# Patient Record
Sex: Male | Born: 1955 | Race: Black or African American | Hispanic: No | Marital: Single | Smoking: Former smoker
Health system: Southern US, Community
[De-identification: ages and names within clinical notes are randomized; demographics above are authoritative.]

## PROBLEM LIST (undated history)

## (undated) DIAGNOSIS — I10 Essential (primary) hypertension: Secondary | ICD-10-CM

## (undated) DIAGNOSIS — Z21 Asymptomatic human immunodeficiency virus [HIV] infection status: Secondary | ICD-10-CM

## (undated) DIAGNOSIS — C801 Malignant (primary) neoplasm, unspecified: Secondary | ICD-10-CM

## (undated) DIAGNOSIS — F4310 Post-traumatic stress disorder, unspecified: Secondary | ICD-10-CM

## (undated) HISTORY — PX: FEMUR FRACTURE SURGERY: SHX633

## (undated) HISTORY — PX: HERNIA REPAIR: SHX51

## (undated) HISTORY — PX: WRIST SURGERY: SHX841

## (undated) HISTORY — PX: HIP SURGERY: SHX245

## (undated) HISTORY — DX: Post-traumatic stress disorder, unspecified: F43.10

## (undated) HISTORY — PX: SPLENECTOMY: SUR1306

---

## 1997-06-17 ENCOUNTER — Encounter: Admission: RE | Admit: 1997-06-17 | Discharge: 1997-09-15 | Payer: Self-pay | Admitting: Orthopedic Surgery

## 1997-07-16 ENCOUNTER — Encounter: Admission: RE | Admit: 1997-07-16 | Discharge: 1997-10-14 | Payer: Self-pay | Admitting: Sports Medicine

## 1999-07-22 ENCOUNTER — Emergency Department (HOSPITAL_COMMUNITY): Admission: EM | Admit: 1999-07-22 | Discharge: 1999-07-22 | Payer: Self-pay | Admitting: Emergency Medicine

## 2000-04-28 ENCOUNTER — Encounter: Payer: Self-pay | Admitting: Emergency Medicine

## 2000-04-28 ENCOUNTER — Emergency Department (HOSPITAL_COMMUNITY): Admission: EM | Admit: 2000-04-28 | Discharge: 2000-04-28 | Payer: Self-pay | Admitting: Emergency Medicine

## 2000-05-12 ENCOUNTER — Encounter: Payer: Self-pay | Admitting: Emergency Medicine

## 2000-05-12 ENCOUNTER — Emergency Department (HOSPITAL_COMMUNITY): Admission: EM | Admit: 2000-05-12 | Discharge: 2000-05-12 | Payer: Self-pay | Admitting: Emergency Medicine

## 2001-09-17 ENCOUNTER — Emergency Department (HOSPITAL_COMMUNITY): Admission: EM | Admit: 2001-09-17 | Discharge: 2001-09-17 | Payer: Self-pay | Admitting: *Deleted

## 2001-09-24 ENCOUNTER — Emergency Department (HOSPITAL_COMMUNITY): Admission: EM | Admit: 2001-09-24 | Discharge: 2001-09-24 | Payer: Self-pay | Admitting: Emergency Medicine

## 2001-09-24 ENCOUNTER — Encounter: Payer: Self-pay | Admitting: Emergency Medicine

## 2002-11-28 ENCOUNTER — Inpatient Hospital Stay (HOSPITAL_COMMUNITY): Admission: EM | Admit: 2002-11-28 | Discharge: 2002-12-08 | Payer: Self-pay | Admitting: Emergency Medicine

## 2002-11-28 ENCOUNTER — Encounter: Payer: Self-pay | Admitting: Emergency Medicine

## 2002-11-30 ENCOUNTER — Encounter: Payer: Self-pay | Admitting: Internal Medicine

## 2002-12-01 ENCOUNTER — Encounter: Payer: Self-pay | Admitting: Internal Medicine

## 2002-12-03 ENCOUNTER — Encounter: Payer: Self-pay | Admitting: Infectious Diseases

## 2002-12-04 ENCOUNTER — Encounter: Payer: Self-pay | Admitting: Infectious Diseases

## 2002-12-05 ENCOUNTER — Encounter: Payer: Self-pay | Admitting: Infectious Diseases

## 2002-12-07 ENCOUNTER — Encounter: Payer: Self-pay | Admitting: Infectious Diseases

## 2002-12-08 ENCOUNTER — Encounter: Payer: Self-pay | Admitting: Internal Medicine

## 2002-12-21 ENCOUNTER — Encounter: Admission: RE | Admit: 2002-12-21 | Discharge: 2002-12-21 | Payer: Self-pay | Admitting: Infectious Diseases

## 2002-12-30 ENCOUNTER — Ambulatory Visit (HOSPITAL_COMMUNITY): Admission: RE | Admit: 2002-12-30 | Discharge: 2002-12-30 | Payer: Self-pay | Admitting: Internal Medicine

## 2002-12-30 ENCOUNTER — Encounter: Payer: Self-pay | Admitting: Internal Medicine

## 2002-12-30 ENCOUNTER — Encounter: Admission: RE | Admit: 2002-12-30 | Discharge: 2002-12-30 | Payer: Self-pay | Admitting: Internal Medicine

## 2002-12-30 ENCOUNTER — Inpatient Hospital Stay (HOSPITAL_COMMUNITY): Admission: AD | Admit: 2002-12-30 | Discharge: 2003-01-10 | Payer: Self-pay | Admitting: Infectious Diseases

## 2002-12-31 ENCOUNTER — Encounter: Payer: Self-pay | Admitting: Infectious Diseases

## 2003-01-01 ENCOUNTER — Encounter: Payer: Self-pay | Admitting: Infectious Diseases

## 2003-01-07 ENCOUNTER — Encounter (INDEPENDENT_AMBULATORY_CARE_PROVIDER_SITE_OTHER): Payer: Self-pay | Admitting: Specialist

## 2003-01-11 ENCOUNTER — Emergency Department (HOSPITAL_COMMUNITY): Admission: EM | Admit: 2003-01-11 | Discharge: 2003-01-11 | Payer: Self-pay | Admitting: Emergency Medicine

## 2003-01-12 ENCOUNTER — Inpatient Hospital Stay (HOSPITAL_COMMUNITY): Admission: EM | Admit: 2003-01-12 | Discharge: 2003-01-21 | Payer: Self-pay | Admitting: Emergency Medicine

## 2003-02-03 ENCOUNTER — Encounter: Admission: RE | Admit: 2003-02-03 | Discharge: 2003-02-03 | Payer: Self-pay | Admitting: General Surgery

## 2003-04-01 ENCOUNTER — Encounter: Admission: RE | Admit: 2003-04-01 | Discharge: 2003-04-01 | Payer: Self-pay | Admitting: Internal Medicine

## 2003-04-01 ENCOUNTER — Ambulatory Visit (HOSPITAL_COMMUNITY): Admission: RE | Admit: 2003-04-01 | Discharge: 2003-04-01 | Payer: Self-pay | Admitting: Internal Medicine

## 2003-04-15 ENCOUNTER — Encounter: Admission: RE | Admit: 2003-04-15 | Discharge: 2003-04-15 | Payer: Self-pay | Admitting: Internal Medicine

## 2003-05-12 ENCOUNTER — Encounter: Admission: RE | Admit: 2003-05-12 | Discharge: 2003-05-12 | Payer: Self-pay | Admitting: Infectious Diseases

## 2004-03-05 ENCOUNTER — Emergency Department (HOSPITAL_COMMUNITY): Admission: EM | Admit: 2004-03-05 | Discharge: 2004-03-05 | Payer: Self-pay | Admitting: Family Medicine

## 2004-03-17 ENCOUNTER — Ambulatory Visit (HOSPITAL_COMMUNITY): Admission: RE | Admit: 2004-03-17 | Discharge: 2004-03-17 | Payer: Self-pay | Admitting: Sports Medicine

## 2004-03-27 ENCOUNTER — Ambulatory Visit (HOSPITAL_COMMUNITY): Admission: RE | Admit: 2004-03-27 | Discharge: 2004-03-27 | Payer: Self-pay | Admitting: Sports Medicine

## 2004-05-08 IMAGING — CT CT PELVIS W/ CM
1 series · 16 of 32 positions shown, 20 images · IV contrast (GASTRO. & OMNIPAQUE [ID])
Comparison: none

CLINICAL DATA: Pelvic pain postoperatively.  Question abscess.  CON-NONE.
 CT OF THE ABDOMEN WITH CONTRAST
 Multidetector helical study performed during IV contrast enhancement with 100 cc of Omnipaque 300 injected at 3 cc per second. Comparison is made with 12/31/02 study.  Again seen is a small pericardial effusion.  The liver, spleen, and pancreas have normal appearance.  Several small left renal cysts noted - stable.  No evidence for an intra-abdominal abscess.  There is  a lack of intra-abdominal fat.  There is retained stool seen within the colon.
 IMPRESSION
 Small pericardial effusion again noted.  No small left renal cyst.  No evidence for an intra-abdominal abscess.
 CT OF THE PELVIS WITH IV CONTRAST
 There is no evidence for a pelvic abscess or perirectal abscess.  There is retained stool seen throughout the colon.  There is a lack of intra-abdominal fat.  There is deformity of the right iliac bone laterally - unchanged.  
 No acute process and no evidence for a pelvic or perirectal abscess.

[Series 2: routine abdomen · axial · 0.68mm/px · z∈[-370,-10]mm · 16 of 116 slices shown, 20 images]
[im 12/116  soft-tissue]
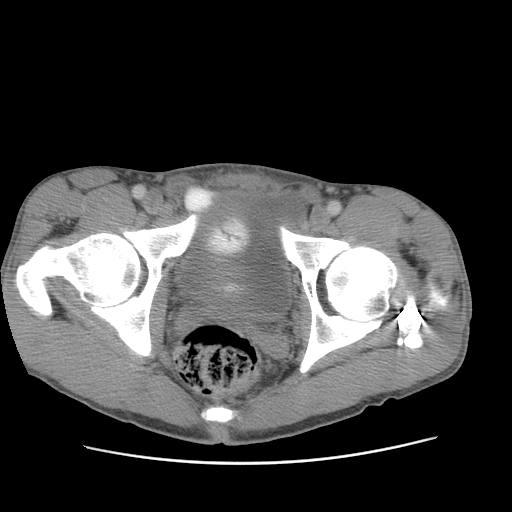
[im 12/116  bone]
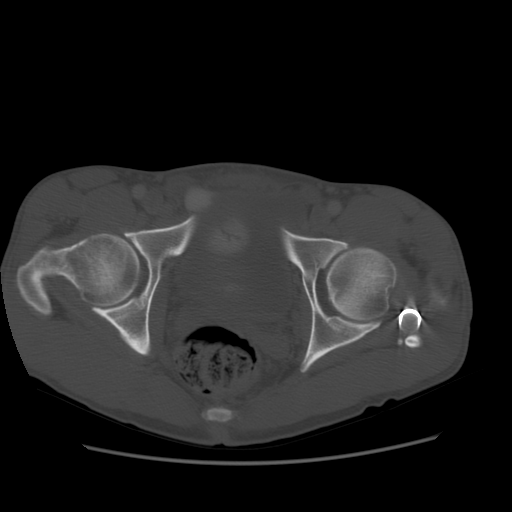
[im 19/116  soft-tissue]
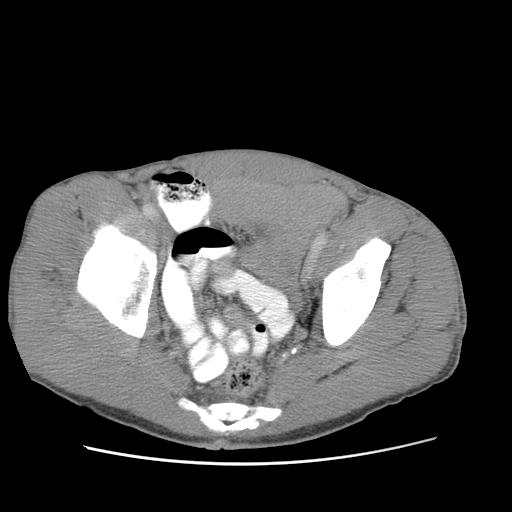
[im 26/116  soft-tissue]
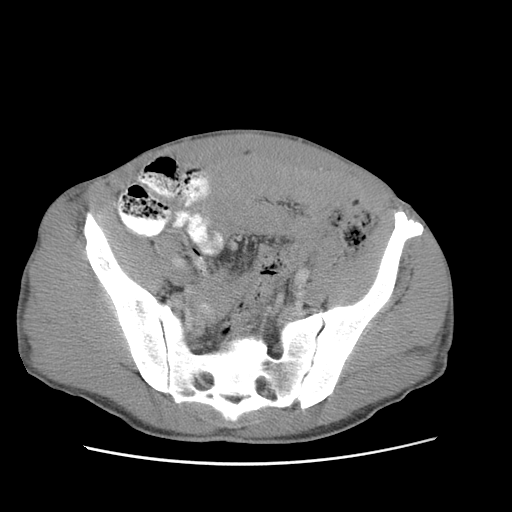
[im 34/116  soft-tissue]
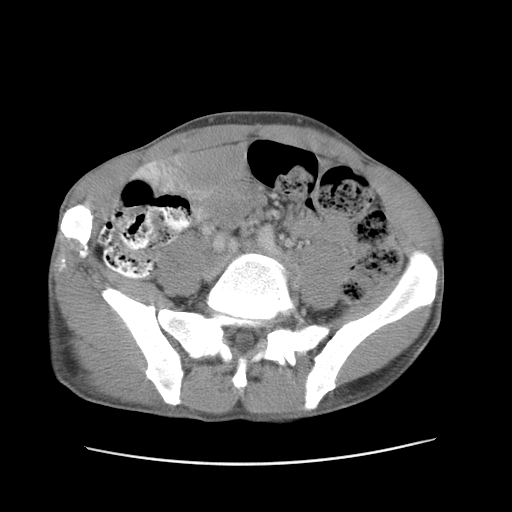
[im 41/116  soft-tissue]
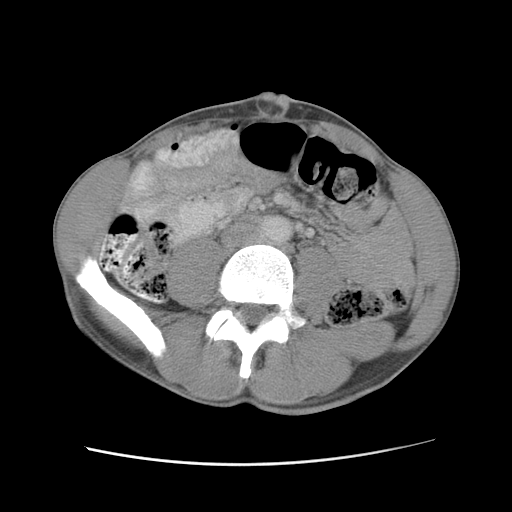
[im 49/116  soft-tissue]
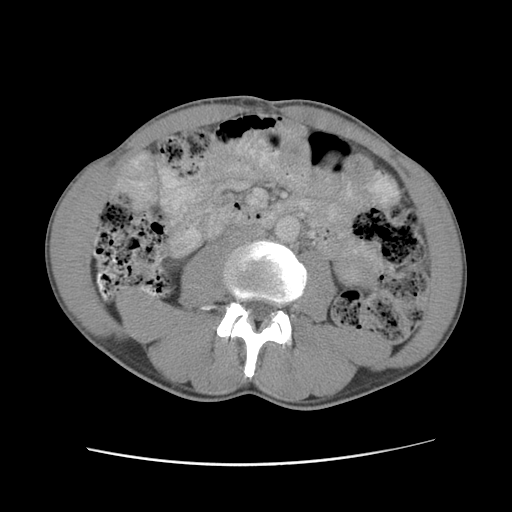
[im 56/116  soft-tissue]
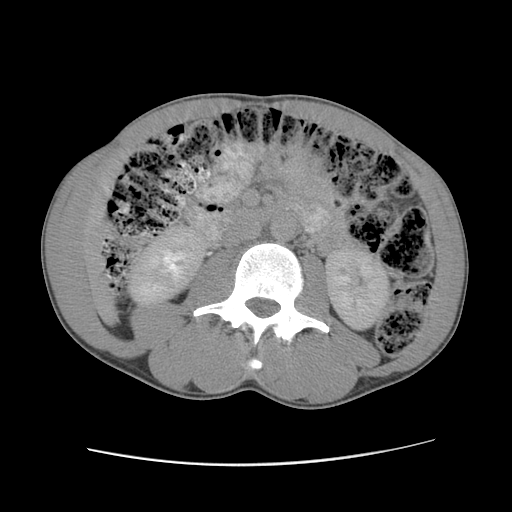
[im 64/116  soft-tissue]
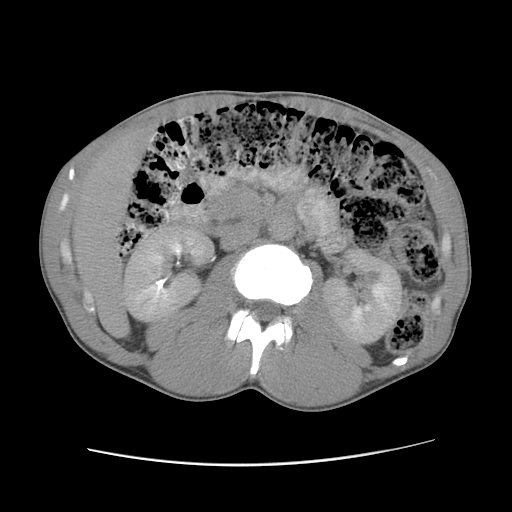
[im 71/116  soft-tissue]
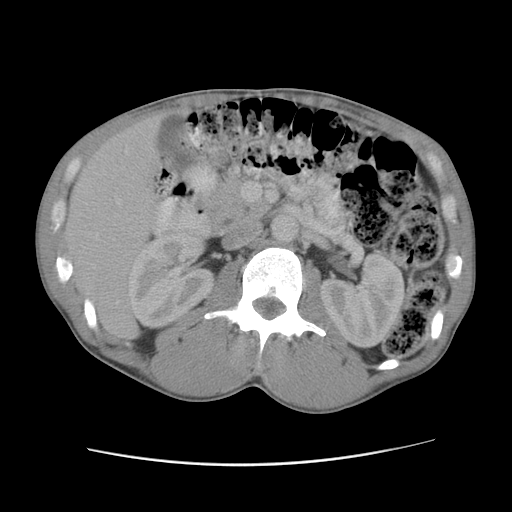
[im 71/116  bone]
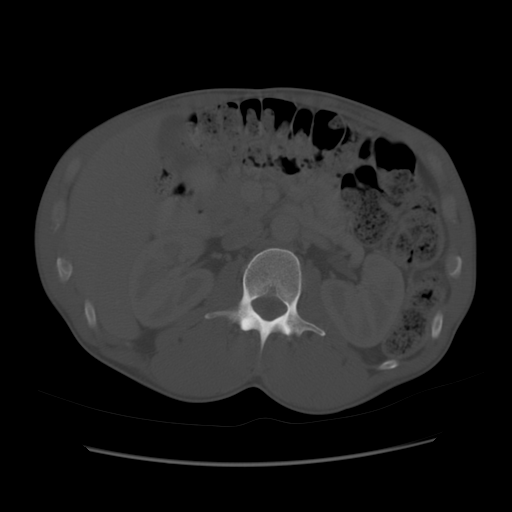
[im 78/116  soft-tissue]
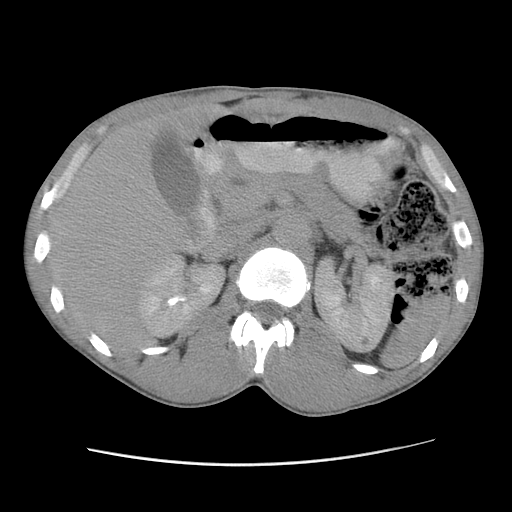
[im 86/116  soft-tissue]
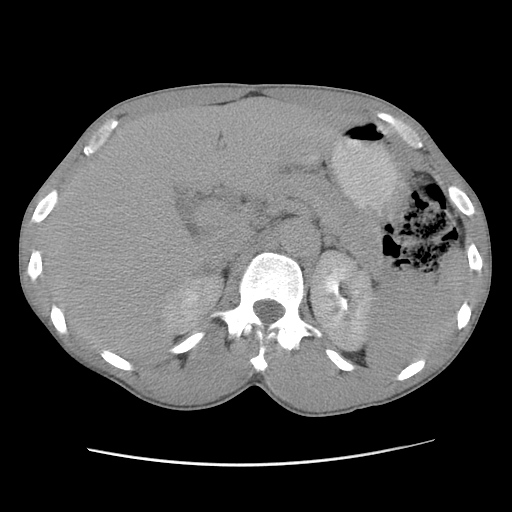
[im 93/116  soft-tissue]
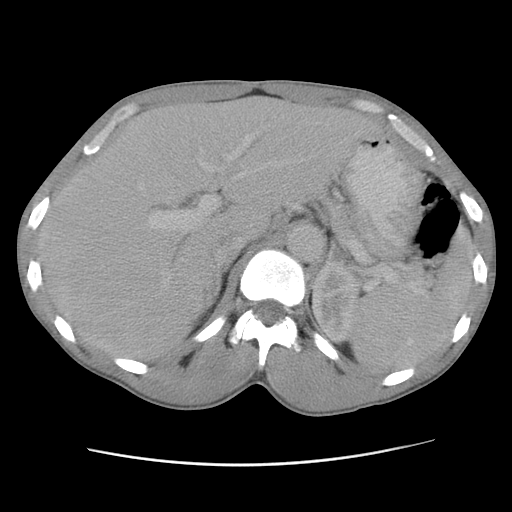
[im 101/116  soft-tissue]
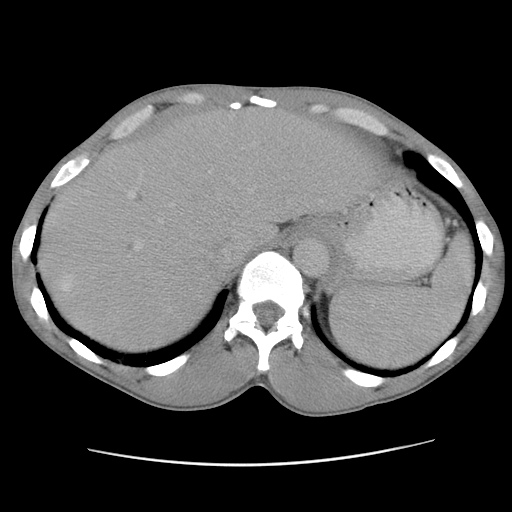
[im 101/116  lung]
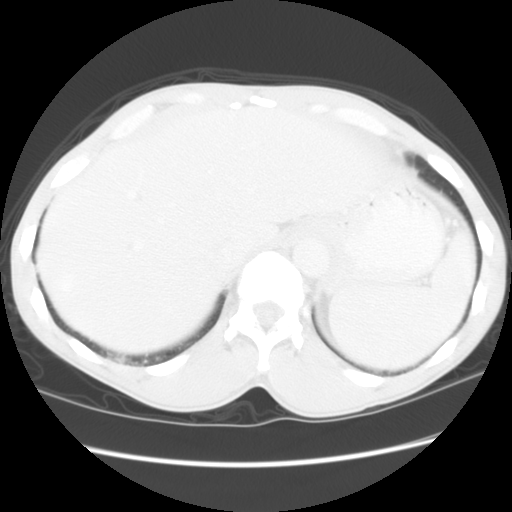
[im 104/116  lung]
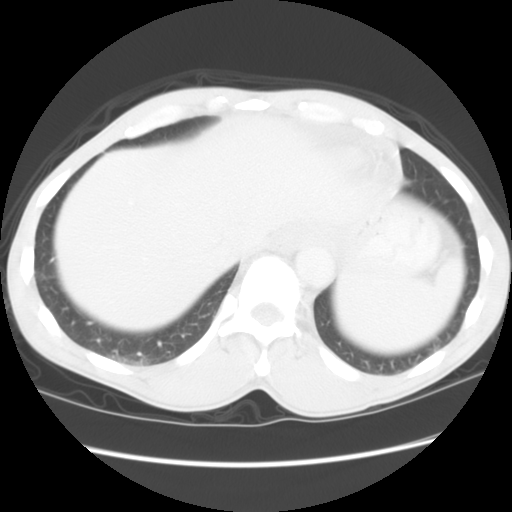
[im 108/116  soft-tissue]
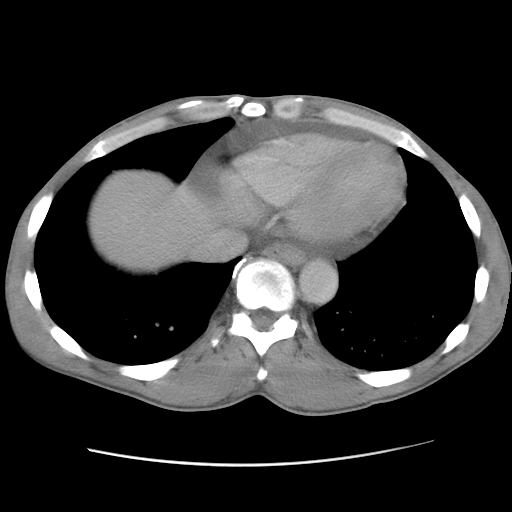
[im 108/116  lung]
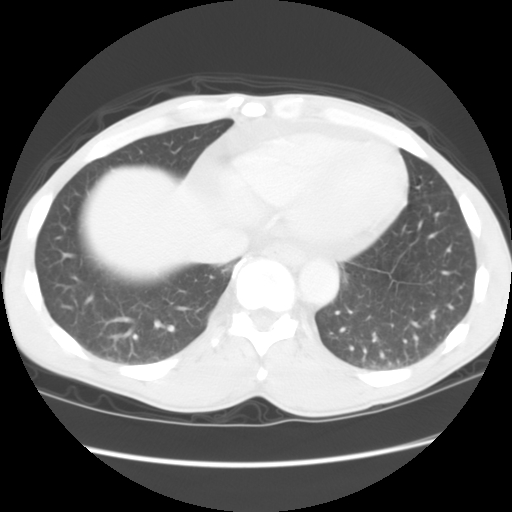
[im 112/116  lung]
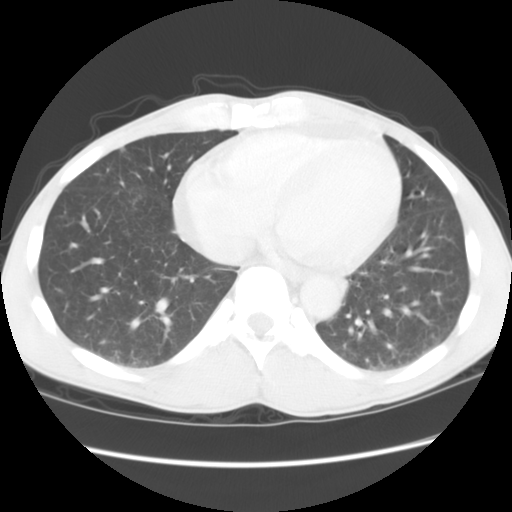

[16 of 32 positions shown; findings below may reference images not displayed]

## 2005-05-14 ENCOUNTER — Emergency Department (HOSPITAL_COMMUNITY): Admission: EM | Admit: 2005-05-14 | Discharge: 2005-05-14 | Payer: Self-pay | Admitting: Family Medicine

## 2005-07-02 ENCOUNTER — Emergency Department (HOSPITAL_COMMUNITY): Admission: EM | Admit: 2005-07-02 | Discharge: 2005-07-02 | Payer: Self-pay | Admitting: Emergency Medicine

## 2006-07-30 ENCOUNTER — Emergency Department (HOSPITAL_COMMUNITY): Admission: EM | Admit: 2006-07-30 | Discharge: 2006-07-30 | Payer: Self-pay | Admitting: Family Medicine

## 2006-10-08 ENCOUNTER — Emergency Department (HOSPITAL_COMMUNITY): Admission: EM | Admit: 2006-10-08 | Discharge: 2006-10-08 | Payer: Self-pay | Admitting: Emergency Medicine

## 2009-05-25 ENCOUNTER — Emergency Department (HOSPITAL_COMMUNITY): Admission: EM | Admit: 2009-05-25 | Discharge: 2009-05-25 | Payer: Self-pay | Admitting: Emergency Medicine

## 2010-04-08 ENCOUNTER — Encounter: Payer: Self-pay | Admitting: Sports Medicine

## 2010-04-08 ENCOUNTER — Encounter: Payer: Self-pay | Admitting: Internal Medicine

## 2010-04-10 ENCOUNTER — Emergency Department (HOSPITAL_COMMUNITY)
Admission: EM | Admit: 2010-04-10 | Discharge: 2010-04-10 | Payer: Self-pay | Source: Home / Self Care | Admitting: Emergency Medicine

## 2010-04-11 LAB — COMPREHENSIVE METABOLIC PANEL
ALT: 27 U/L (ref 0–53)
AST: 24 U/L (ref 0–37)
Albumin: 3.9 g/dL (ref 3.5–5.2)
Alkaline Phosphatase: 53 U/L (ref 39–117)
BUN: 8 mg/dL (ref 6–23)
CO2: 30 mEq/L (ref 19–32)
Calcium: 9.7 mg/dL (ref 8.4–10.5)
Chloride: 108 mEq/L (ref 96–112)
Creatinine, Ser: 1.15 mg/dL (ref 0.4–1.5)
GFR calc Af Amer: >60 mL/min (ref >60)
GFR calc non Af Amer: >60 mL/min (ref >60)
Glucose, Bld: 98 mg/dL (ref 70–99)
Potassium: 4.4 mEq/L (ref 3.5–5.1)
Sodium: 143 mEq/L (ref 135–145)
Total Bilirubin: 0.8 mg/dL (ref 0.3–1.2)
Total Protein: 7.2 g/dL (ref 6.0–8.3)

## 2010-04-11 LAB — LIPASE, BLOOD: Lipase: 18 U/L (ref 11–59)

## 2010-04-11 LAB — CBC
HCT: 44 % (ref 39.0–52.0)
Hemoglobin: 14.9 g/dL (ref 13.0–17.0)
MCH: 29.9 pg (ref 26.0–34.0)
MCHC: 33.9 g/dL (ref 30.0–36.0)
MCV: 88.2 fL (ref 78.0–100.0)
Platelets: 200 10*3/uL (ref 150–400)
RBC: 4.99 MIL/uL (ref 4.22–5.81)
RDW: 14.8 % (ref 11.5–15.5)
WBC: 3.7 10*3/uL — ABNORMAL LOW (ref 4.0–10.5)

## 2010-04-11 LAB — DIFFERENTIAL
Basophils Absolute: 0 10*3/uL (ref 0.0–0.1)
Basophils Relative: 1 % (ref 0–1)
Eosinophils Absolute: 0 10*3/uL (ref 0.0–0.7)
Eosinophils Relative: 1 % (ref 0–5)
Lymphocytes Relative: 36 % (ref 12–46)
Lymphs Abs: 1.3 10*3/uL (ref 0.7–4.0)
Monocytes Absolute: 0.3 10*3/uL (ref 0.1–1.0)
Monocytes Relative: 9 % (ref 3–12)
Neutro Abs: 2 10*3/uL (ref 1.7–7.7)
Neutrophils Relative %: 54 % (ref 43–77)

## 2010-04-11 LAB — URINALYSIS, ROUTINE W REFLEX MICROSCOPIC
Bilirubin Urine: NEGATIVE
Hgb urine dipstick: NEGATIVE
Ketones, ur: NEGATIVE mg/dL
Nitrite: NEGATIVE
Protein, ur: NEGATIVE mg/dL
Specific Gravity, Urine: 1.017 (ref 1.005–1.030)
Urine Glucose, Fasting: NEGATIVE mg/dL
Urobilinogen, UA: 0.2 mg/dL (ref 0.0–1.0)
pH: 6 (ref 5.0–8.0)

## 2010-08-04 NOTE — Discharge Summary (Signed)
   NAME:  THI, SISEMORE                     ACCOUNT NO.:  [id number]   MEDICAL RECORD NO.:  [id number]                   PATIENT TYPE:  INP   LOCATION:  5736                                 FACILITY:  [facility]   PHYSICIAN:  Madaline Guthrie, M.D.                 DATE OF BIRTH:  08/20/1955   DATE OF ADMISSION:  01/19/2003  DATE OF DISCHARGE:  01/21/2003                                 DISCHARGE SUMMARY   DISCHARGE DIAGNOSES:  1. Human immunodeficiency virus-positive with less than 90 CD4 cells.  2. Status post extensive hemorrhoidectomy.  3. Fevers.  4. 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  5. Extensive human immunodeficiency virus neuropathy.   DISCHARGE MEDICATIONS:  Mr. Petrik left suddenly and against medical  advise.  The only medicine he was discharged with was a prescription for  Tylox, which Dr. Abigail Miyamoto had left on the chart for him.   FOLLOWUP:  Mr. Souder reported that he would follow up with the [facility] on Monday, January 25, 2003.   PROCEDURES:  DICTATION ENDED AT THIS POINT.      Tama Headings, MD                           Madaline Guthrie, M.D.    AS/MEDQ  D:  01/26/2003  T:  01/27/2003  Job:  [id number]
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
## 2010-08-04 NOTE — Op Note (Signed)
NAME:  ANGELUS, HOOPES                     ACCOUNT NO.:  [id number]   MEDICAL RECORD NO.:  [id number]                   PATIENT TYPE:  INP   LOCATION:  0165                                 FACILITY:  [facility]   PHYSICIAN:  Adolph Pollack, M.D.            DATE OF BIRTH:  May 14, 1955   DATE OF PROCEDURE:  01/12/2003  DATE OF DISCHARGE:                                 OPERATIVE REPORT   PREOPERATIVE DIAGNOSIS:  Post hemorrhoidectomy bleeding.   POSTOPERATIVE DIAGNOSIS:  Post hemorrhoidectomy bleeding plus lower  gastrointestinal bleeding.   OPERATION/PROCEDURE:  1. Examination under anesthesia.  2. Anoscopy.  3. Control of rectal wall bleeding.  4. Rigid proctosigmoidoscopy.   SURGEON:  Adolph Pollack, M.D.   ANESTHESIA:  General.   INDICATIONS:  This 55 year old male is postoperative day #5 from  hemorrhoidectomy by Dr. Lindie Spruce.  He was sent home on Sunday.  He said he was  constipated and then had a bowel movement yesterday with a little bit of  blood and then today he has been passing a lot of blood.  He presented to  the [facility] Emergency Department for evaluation.  His hemoglobin on  September 23 was 9.8.  In the emergency room, today it was 9.0.  He had some  bright red blood around the anal area.  It was difficult to examine him.  He  is now brought to the operating room.   FINDINGS:  In the left lateral rectal wall, there was a raw surface with  some bleeding.  After controlling this, I performed rigid  proctosigmoidoscopy and I got at the highest 23 cm and still had old blood  clots and I could not get above these.   DESCRIPTION OF PROCEDURE:  He is brought to the operating room, placed  supine on the operating table and general anesthesia was administered.  He  was then placed in the lithotomy position.  The perianal area was sterilely  prepped and draped.  An anoscope was inserted into the anus and I examined  the area.  On the left there was some  bright red blood and multiple clots  noted.  I evacuated these clots, some of which were bright red, most of  which were dark.  I noted in the left rectal wall there was some oozing and  I put the cautery to this area with fairly good control.  I inspected all  the other areas.  There still did continue to be some oozing from the left  rectal wall and I continued to apply cautery and direct pressure until I  could get this under control.  Once I had this controlled, I then took a  rigid proctosigmoidoscope and advanced it up into the rectum and into the  sigmoid colon.  I continued to encounter multiple blood clots even 23 cm  from the anal verge.  I could not get above the blood clots.  I was  wondering if he did not have a GI source or if this was a retrograde  movement of the bleeding from the rectal area.   I went down and again inspected to raw surface of the rectal area and then  used a single 2-0 suture to approximate some of the rectum in that region.  I placed one small piece of Gelfoam directly on the raw surface where it had  been cauterized in the left rectal wall, then a larger piece was placed into  the anus.  A dry dressing was applied.   He tolerated the procedure well.  There were no apparent complications.  He  was not hypotensive through the procedure.   The plan will be to take him to the recovery room and then to the ICU and  get serial hematocrits on him.  I will request GI consultation with the  possible need for colonoscopy to detect a lower GI source of bleeding.                                                 Adolph Pollack, M.D.    Kari Baars  D:  01/12/2003  T:  01/12/2003  Job:  [id number]

## 2010-08-04 NOTE — Discharge Summary (Signed)
NAME:  Alexander Nicholson, Alexander Nicholson                     ACCOUNT NO.:  [id number]   MEDICAL RECORD NO.:  [id number]                   PATIENT TYPE:  INP   LOCATION:  0481                                 FACILITY:  [facility]   PHYSICIAN:  Sherin Quarry, MD                   DATE OF BIRTH:  [birth year]   DATE OF ADMISSION:  11/28/2002  DATE OF DISCHARGE:                                 DISCHARGE SUMMARY   HISTORY OF PRESENT ILLNESS:  Alexander Nicholson is a 55 year old gentleman  who initially presented on September 11 with a four week history of fever,  anorexia, and stomach pain with associated dyspnea on exertion and  nonproductive cough.  He estimated that he had lost about 20 pounds in  weight.  He has a long-standing history of alcohol abuse, but indicated that  he had discontinued use of alcohol about 10 days prior to coming into the  hospital.   PHYSICAL EXAMINATION:  VITAL SIGNS:  Blood pressure 100/70, heart rate 147,  respirations 20, temperature 103.8, O2 saturation 94%.  HEENT:  Within normal limits.  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  CHEST:  Fine rales diffusely.  Breath sounds seemed to be slightly  diminished.  BACK:  No CVA or point tenderness.  CARDIOVASCULAR:  Normal S1 and S2.  There were no rubs, murmurs, or gallops.  ABDOMEN:  Benign.  Normal bowel sounds without masses, tenderness,  organomegaly.  NEUROLOGIC:  Normal.  EXTREMITIES:  Normal.   LABORATORIES:  Subsequently, the patient had serial blood cultures which  were negative.  Cryptococcal antigen was negative.  RPR was nonreactive.  Toxoplasma serology was negative.  HIV serology was positive.  This was  confirmed by Western Blot.  The serum sodium was 135, potassium 3.8,  chloride 101, bicarbonate 26, creatinine 1.3, BUN 23.  Chest x-ray showed a  diffuse infiltrative process consistent with a bilateral atypical pneumonia.   HOSPITAL COURSE:  On admission the patient was  initially placed on a regimen  of Zithromax 500 mg q.24h., Rocephin 1 g q.24h., and Septra 20 mg/kg with  trimethoprim component divided into four equal doses daily.  Subsequently,  on September 12 the patient was started on prednisone 40 mg daily.  On  September 14 the patient was noted to be more hypoxic and tachycardic with  moderate respiratory distress.  The patient was therefore transferred to the  intensive care unit for more careful monitoring.  Consultation was obtained  with Rockey Situ. Roxan Hockey, M.D. of the infectious disease service who  recommended that the patient be maintained on his current regimen.  He  recommended that bronchoscopy be carried out if the patient failed to  respond to this treatment regimen.  Subsequently, the patient was very  carefully monitored.  By September 21 the patient was afebrile.  His LDH  values were nearing normal.  His chest  x-ray was improved and was felt  reasonable to discharge the patient at that time.   DISCHARGE DIAGNOSES:  1. Human immunodeficiency virus infection with CD-4 count of 30 with T     helper percentage of 9%.  2. 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  3. Chronic alcohol abuse.  4. 50-pack-year smoking history.   DISCHARGE MEDICATIONS:  1. Bactrim DS two tablets t.i.d. for two more weeks.  2. Prednisone 40 mg daily for five days, then 20 mg daily for 11 days.  3. Pepcid 20 mg daily.  4. Truvada one tablet daily.  5. Sustiva 600 mg one tablet daily.   FOLLOWUP:  The patient will follow up with Rockey Situ. Roxan Hockey, M.D. in his  office on October 4.  This appointment has been scheduled.   CONDITION ON DISCHARGE:  Fair.                                               Sherin Quarry, MD    SY/MEDQ  D:  12/08/2002  T:  12/08/2002  Job:  [id number]
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
## 2010-08-23 ENCOUNTER — Emergency Department (HOSPITAL_COMMUNITY)
Admission: EM | Admit: 2010-08-23 | Discharge: 2010-08-23 | Disposition: A | Discharge status: Home / Self Care | Payer: Non-veteran care | Attending: Emergency Medicine | Admitting: Emergency Medicine

## 2010-08-23 ENCOUNTER — Emergency Department (HOSPITAL_COMMUNITY): Payer: Non-veteran care

## 2010-08-23 DIAGNOSIS — R197 Diarrhea, unspecified: Secondary | ICD-10-CM | POA: Insufficient documentation

## 2010-08-23 DIAGNOSIS — R109 Unspecified abdominal pain: Secondary | ICD-10-CM | POA: Insufficient documentation

## 2010-08-23 DIAGNOSIS — R112 Nausea with vomiting, unspecified: Secondary | ICD-10-CM | POA: Insufficient documentation

## 2010-08-23 DIAGNOSIS — Z21 Asymptomatic human immunodeficiency virus [HIV] infection status: Secondary | ICD-10-CM | POA: Insufficient documentation

## 2010-08-23 DIAGNOSIS — I10 Essential (primary) hypertension: Secondary | ICD-10-CM | POA: Insufficient documentation

## 2010-08-23 LAB — DIFFERENTIAL
Basophils Absolute: 0 10*3/uL (ref 0.0–0.1)
Basophils Relative: 0 % (ref 0–1)
Lymphocytes Relative: 50 % — ABNORMAL HIGH (ref 12–46)
Monocytes Absolute: 0.3 10*3/uL (ref 0.1–1.0)
Neutro Abs: 1.5 10*3/uL — ABNORMAL LOW (ref 1.7–7.7)
Neutrophils Relative %: 40 % — ABNORMAL LOW (ref 43–77)

## 2010-08-23 LAB — CBC
Hemoglobin: 14 g/dL (ref 13.0–17.0)
MCHC: 33.1 g/dL (ref 30.0–36.0)
WBC: 3.7 10*3/uL — ABNORMAL LOW (ref 4.0–10.5)

## 2010-08-23 LAB — URINALYSIS, ROUTINE W REFLEX MICROSCOPIC
Glucose, UA: NEGATIVE mg/dL
Protein, ur: NEGATIVE mg/dL
Specific Gravity, Urine: 1.024 (ref 1.005–1.030)
Urobilinogen, UA: 0.2 mg/dL (ref 0.0–1.0)

## 2010-08-23 LAB — COMPREHENSIVE METABOLIC PANEL
ALT: 36 U/L (ref 0–53)
AST: 30 U/L (ref 0–37)
Albumin: 4.1 g/dL (ref 3.5–5.2)
Calcium: 9.3 mg/dL (ref 8.4–10.5)
Chloride: 106 mEq/L (ref 96–112)
Creatinine, Ser: 1.09 mg/dL (ref 0.4–1.5)
GFR calc Af Amer: >60 mL/min (ref >60)
Sodium: 138 mEq/L (ref 135–145)

## 2010-08-23 MED ORDER — IOHEXOL 300 MG/ML  SOLN
100.0000 mL | Freq: Once | INTRAMUSCULAR | Status: AC | PRN
  Administered 2010-08-23: 100 mL via INTRAVENOUS

## 2011-01-01 LAB — URINALYSIS, ROUTINE W REFLEX MICROSCOPIC
Glucose, UA: NEGATIVE
Hgb urine dipstick: NEGATIVE
Specific Gravity, Urine: 1.021

## 2011-01-01 LAB — CBC
MCV: 95
Platelets: 213
WBC: 4.8

## 2011-01-01 LAB — DIFFERENTIAL
Basophils Absolute: 0
Eosinophils Relative: 0
Lymphocytes Relative: 36
Monocytes Absolute: 0.3

## 2011-01-01 LAB — COMPREHENSIVE METABOLIC PANEL
AST: 27
Albumin: 3.9
Chloride: 105
Creatinine, Ser: 1.35
GFR calc Af Amer: >60
Potassium: 3.7
Sodium: 138
Total Bilirubin: 1.1

## 2011-01-01 LAB — CULTURE, BLOOD (ROUTINE X 2): Culture: NO GROWTH

## 2011-02-20 ENCOUNTER — Emergency Department (HOSPITAL_COMMUNITY)
Admission: EM | Admit: 2011-02-20 | Discharge: 2011-02-20 | Disposition: A | Discharge status: Home / Self Care | Payer: Non-veteran care | Attending: Emergency Medicine | Admitting: Emergency Medicine

## 2011-02-20 ENCOUNTER — Other Ambulatory Visit: Payer: Self-pay

## 2011-02-20 ENCOUNTER — Emergency Department (HOSPITAL_COMMUNITY): Payer: Non-veteran care

## 2011-02-20 ENCOUNTER — Encounter: Payer: Self-pay | Admitting: *Deleted

## 2011-02-20 DIAGNOSIS — I10 Essential (primary) hypertension: Secondary | ICD-10-CM | POA: Insufficient documentation

## 2011-02-20 DIAGNOSIS — R05 Cough: Secondary | ICD-10-CM | POA: Insufficient documentation

## 2011-02-20 DIAGNOSIS — J40 Bronchitis, not specified as acute or chronic: Secondary | ICD-10-CM

## 2011-02-20 DIAGNOSIS — R059 Cough, unspecified: Secondary | ICD-10-CM | POA: Insufficient documentation

## 2011-02-20 DIAGNOSIS — R509 Fever, unspecified: Secondary | ICD-10-CM | POA: Insufficient documentation

## 2011-02-20 DIAGNOSIS — R079 Chest pain, unspecified: Secondary | ICD-10-CM | POA: Insufficient documentation

## 2011-02-20 DIAGNOSIS — Z21 Asymptomatic human immunodeficiency virus [HIV] infection status: Secondary | ICD-10-CM | POA: Insufficient documentation

## 2011-02-20 HISTORY — DX: Asymptomatic human immunodeficiency virus (hiv) infection status: Z21

## 2011-02-20 HISTORY — DX: Essential (primary) hypertension: I10

## 2011-02-20 MED ORDER — ALBUTEROL SULFATE HFA 108 (90 BASE) MCG/ACT IN AERS
2.0000 | INHALATION_SPRAY | Freq: Once | RESPIRATORY_TRACT | Status: AC
  Administered 2011-02-20: 2 via RESPIRATORY_TRACT
  Filled 2011-02-20: qty 6.7

## 2011-02-20 MED ORDER — OSELTAMIVIR PHOSPHATE 75 MG PO CAPS: 75.0000 mg | ORAL_CAPSULE | Freq: Two times a day (BID) | ORAL | Status: AC

## 2011-02-20 MED ORDER — DOXYCYCLINE HYCLATE 100 MG PO CAPS: 100.0000 mg | ORAL_CAPSULE | Freq: Two times a day (BID) | ORAL | Status: AC

## 2011-02-20 MED ORDER — HYDROCODONE-ACETAMINOPHEN 5-500 MG PO TABS: 1.0000 | ORAL_TABLET | Freq: Four times a day (QID) | ORAL | Status: DC | PRN

## 2011-02-20 MED ORDER — PREDNISONE 20 MG PO TABS: 20.0000 mg | ORAL_TABLET | Freq: Two times a day (BID) | ORAL | Status: AC

## 2011-02-20 MED ORDER — OXYCODONE-ACETAMINOPHEN 5-325 MG PO TABS
1.0000 | ORAL_TABLET | Freq: Once | ORAL | Status: AC
  Administered 2011-02-20: 1 via ORAL
  Filled 2011-02-20: qty 1

## 2011-02-20 MED ORDER — IPRATROPIUM BROMIDE 0.02 % IN SOLN
0.5000 mg | Freq: Once | RESPIRATORY_TRACT | Status: AC
Start: 2011-02-20 — End: 2011-02-20
  Administered 2011-02-20: 0.5 mg via RESPIRATORY_TRACT
  Filled 2011-02-20: qty 2.5

## 2011-02-20 MED ORDER — AEROCHAMBER Z-STAT PLUS/MEDIUM MISC
1.0000 | Freq: Once | Status: DC
  Filled 2011-02-20: qty 1

## 2011-02-20 MED ORDER — PREDNISONE 20 MG PO TABS
60.0000 mg | ORAL_TABLET | Freq: Once | ORAL | Status: AC
  Administered 2011-02-20: 60 mg via ORAL
  Filled 2011-02-20: qty 3

## 2011-02-20 MED ORDER — ALBUTEROL SULFATE (5 MG/ML) 0.5% IN NEBU
5.0000 mg | INHALATION_SOLUTION | Freq: Once | RESPIRATORY_TRACT | Status: AC
  Administered 2011-02-20: 5 mg via RESPIRATORY_TRACT

## 2011-02-20 MED ORDER — ALBUTEROL SULFATE (5 MG/ML) 0.5% IN NEBU
INHALATION_SOLUTION | RESPIRATORY_TRACT | Status: AC
  Administered 2011-02-20: 5 mg via RESPIRATORY_TRACT
  Filled 2011-02-20: qty 0.5

## 2011-02-20 MED ORDER — DOXYCYCLINE HYCLATE 100 MG PO TABS
100.0000 mg | ORAL_TABLET | Freq: Once | ORAL | Status: AC
  Administered 2011-02-20: 100 mg via ORAL
  Filled 2011-02-20: qty 1

## 2011-02-20 MED ORDER — HYDROCODONE-ACETAMINOPHEN 5-500 MG PO TABS: 1.0000 | ORAL_TABLET | Freq: Four times a day (QID) | ORAL | Status: AC | PRN

## 2011-02-20 NOTE — ED Notes (Signed)
Pt reports non-productive cough and cp since Thursday.  Pt also reports SOB x 2 months now.  [facility].  Pt reports SOB is worse with exertion.  No swelling of the ankles noted.  Pt placed on the heart monitor.

## 2011-02-20 NOTE — ED Provider Notes (Signed)
History     CSN: [id number] Arrival date & time: 02/20/2011 10:27 AM   First MD Initiated Contact with Patient 02/20/11 1135      Chief Complaint  Patient presents with  . Cough    productive, thick, green mucus  . Chest Pain  . Fever    (Consider location/radiation/quality/duration/timing/severity/associated sxs/prior treatment) Patient is a 55 y.o. male presenting with cough. The history is provided by the patient.  Cough The current episode started 2 days ago. The problem occurs every few minutes. The problem has been gradually worsening. The cough is productive of sputum. The maximum temperature recorded prior to his arrival was 101 to 101.9 F. Associated symptoms include chest pain (Intermittently for one week, and thenconstant for 2 days-still present.). He has tried nothing for the symptoms. He is not a smoker. His past medical history does not include bronchitis, COPD or asthma.   He has not had similar problems to this. He did not take an influenza vaccine this year. His chest pain is mild and worsens when he coughs.  Past Medical History  Diagnosis Date  . Hypertension   . HIV positive     Past Surgical History  Procedure Date  . Wrist surgery     right  . Hip surgery     left    No family history on file.  History  Substance Use Topics  . Smoking status: Current Some Day Smoker  . Smokeless tobacco: Not on file  . Alcohol Use: No      Review of Systems  Respiratory: Positive for cough.   Cardiovascular: Positive for chest pain (Intermittently for one week, and thenconstant for 2 days-still present.).  All other systems reviewed and are negative.    Allergies  Review of patient's allergies indicates no known allergies.  Home Medications   Current Outpatient Rx  Name Route Sig Dispense Refill  . DOXYCYCLINE HYCLATE 100 MG PO CAPS Oral Take 1 capsule (100 mg total) by mouth 2 (two) times daily. 20 capsule 0  . HYDROCODONE-ACETAMINOPHEN 5-500 MG PO  TABS Oral Take 1-2 tablets by mouth every 6 (six) hours as needed for pain. 15 tablet 0  . OSELTAMIVIR PHOSPHATE 75 MG PO CAPS Oral Take 1 capsule (75 mg total) by mouth every 12 (twelve) hours. 10 capsule 0  . PREDNISONE 20 MG PO TABS Oral Take 1 tablet (20 mg total) by mouth 2 (two) times daily. 10 tablet 0    BP 138/94  Pulse 77  Temp(Src) 99.8 F (37.7 C) (Oral)  Resp 18  Wt 185 lb (83.915 kg)  SpO2 100%  Physical Exam  Constitutional: He is oriented to person, place, and time. He appears well-developed and well-nourished.  HENT:  Head: Normocephalic and atraumatic.  Eyes: EOM are normal. Pupils are equal, round, and reactive to light.  Neck: Normal range of motion. Neck supple.  Cardiovascular: Normal rate and regular rhythm.   Pulmonary/Chest: He is in respiratory distress (coughing). He has no wheezes. He has no rales. He exhibits no tenderness.  Abdominal: Soft. Bowel sounds are normal.  Musculoskeletal: Normal range of motion. He exhibits no edema and no tenderness.  Neurological: He is alert and oriented to person, place, and time. No cranial nerve deficit. He exhibits normal muscle tone. Coordination normal.  Skin: Skin is warm and dry.  Psychiatric: He has a normal mood and affect. His behavior is normal. Judgment and thought content normal.    ED Course  Procedures (including critical care time)  Date: 02/20/2011  Rate: 81  Rhythm: normal sinus rhythm  QRS Axis: normal  Intervals: normal  ST/T Wave abnormalities: nonspecific ST abnormality  Conduction Disutrbances:none  Narrative Interpretation:   Old EKG Reviewed: unchanged  Labs Reviewed - No data to display Dg Chest 2 View  02/20/2011  *RADIOLOGY REPORT*  Clinical Data: Cough.  Chest congestion.  Mid chest pain.  Fever. Chills.  CHEST - 2 VIEW 02/20/2011:  Comparison: Two-view chest x-ray 10/08/2006 [facility] and two-view chest x-ray 04/01/2003 [facility].  Findings: Cardiac silhouette  mildly enlarged but stable.  Thoracic aorta tortuous, unchanged.  Hilar and mediastinal contours otherwise unremarkable.  Moderate central peribronchial thickening, more so than on the prior examinations.  No localized airspace consolidation.  No pleural effusions.  Slight thoracic scoliosis convex right.  IMPRESSION: Mild cardiomegaly without pulmonary edema.  Mild to moderate changes of bronchitis and/or asthma.  No acute cardiopulmonary disease otherwise.  Original Report Authenticated By: Arnell Sieving, M.D.     1. Bronchitis       MDM  Bronchitis, without pneumonia. Possibility of influenza, although he is 7 days into the illness. However, since he is HIV, disease. We'll cover him with Tamiflu. He is stable for discharge.        Flint Melter, MD 02/20/11 [phone]

## 2011-02-20 NOTE — ED Notes (Signed)
Pt reports breathing tx did not help, it made his feel more "tight."  Pt is also requesting something for pain.  Dr. Effie Shy notified

## 2011-02-20 NOTE — ED Notes (Signed)
Pt states "I started having cough, fever last Thursday, c/p started Friday, hurts worse with coughing"; cough produces thick green mucus

## 2011-03-20 DIAGNOSIS — C801 Malignant (primary) neoplasm, unspecified: Secondary | ICD-10-CM

## 2011-03-20 HISTORY — DX: Malignant (primary) neoplasm, unspecified: C80.1

## 2012-03-07 ENCOUNTER — Encounter (HOSPITAL_COMMUNITY): Payer: Self-pay | Admitting: Emergency Medicine

## 2012-03-07 ENCOUNTER — Emergency Department (HOSPITAL_COMMUNITY): Payer: Non-veteran care

## 2012-03-07 ENCOUNTER — Emergency Department (HOSPITAL_COMMUNITY)
Admission: EM | Admit: 2012-03-07 | Discharge: 2012-03-07 | Disposition: A | Discharge status: Home / Self Care | Payer: Non-veteran care | Attending: Emergency Medicine | Admitting: Emergency Medicine

## 2012-03-07 DIAGNOSIS — F17200 Nicotine dependence, unspecified, uncomplicated: Secondary | ICD-10-CM | POA: Insufficient documentation

## 2012-03-07 DIAGNOSIS — J069 Acute upper respiratory infection, unspecified: Secondary | ICD-10-CM | POA: Insufficient documentation

## 2012-03-07 DIAGNOSIS — Z7982 Long term (current) use of aspirin: Secondary | ICD-10-CM | POA: Insufficient documentation

## 2012-03-07 DIAGNOSIS — J40 Bronchitis, not specified as acute or chronic: Secondary | ICD-10-CM

## 2012-03-07 DIAGNOSIS — I10 Essential (primary) hypertension: Secondary | ICD-10-CM | POA: Insufficient documentation

## 2012-03-07 DIAGNOSIS — Z21 Asymptomatic human immunodeficiency virus [HIV] infection status: Secondary | ICD-10-CM | POA: Insufficient documentation

## 2012-03-07 MED ORDER — ANTIPYRINE-BENZOCAINE 5.4-1.4 % OT SOLN
3.0000 [drp] | Freq: Once | OTIC | Status: AC
  Administered 2012-03-07: 4 [drp] via OTIC
  Filled 2012-03-07: qty 10

## 2012-03-07 MED ORDER — AZITHROMYCIN 250 MG PO TABS
ORAL_TABLET | ORAL | Status: AC
  Filled 2012-03-07: qty 2

## 2012-03-07 MED ORDER — AZITHROMYCIN 250 MG PO TABS: 250.0000 mg | ORAL_TABLET | Freq: Every day | ORAL | Status: DC

## 2012-03-07 MED ORDER — HYDROCODONE-ACETAMINOPHEN 7.5-500 MG/15ML PO SOLN
10.0000 mL | Freq: Once | ORAL | Status: AC
  Administered 2012-03-07: 10 mL via ORAL
  Filled 2012-03-07: qty 15

## 2012-03-07 MED ORDER — HYDROCODONE-ACETAMINOPHEN 7.5-500 MG/15ML PO SOLN: 15.0000 mL | Freq: Four times a day (QID) | ORAL | Status: DC | PRN

## 2012-03-07 MED ORDER — AZITHROMYCIN 250 MG PO TABS
500.0000 mg | ORAL_TABLET | Freq: Once | ORAL | Status: AC
  Administered 2012-03-07: 500 mg via ORAL

## 2012-03-07 NOTE — ED Notes (Signed)
Pt alert, arrives from home, c/o cough, sob, onset was several days ago, states "i tried to get down to the [place]", moist cough noted, resp even unlabored, skin pwd

## 2012-03-07 NOTE — ED Notes (Signed)
Pt with chest pain worse with inspiration coughing up yellow sputum.  Patient started feeling bad on Monday.  Pain extends to patient's left ear and head.  Patient also has nasal congestion.  Has not slept in several nights due to cough.  Also has sore throat.  Body aches and chills. Patient denies vomiting, and diarrhea.

## 2012-03-07 NOTE — ED Provider Notes (Signed)
History     CSN: [id number]  Arrival date & time 03/07/12  2101   First MD Initiated Contact with Patient 03/07/12 2217      Chief Complaint  Patient presents with  . Cough    (Consider location/radiation/quality/duration/timing/severity/associated sxs/prior treatment) HPI  56 year old male with history of HIV presents complaining of URI sxs.  Patient reports for the past 4 days he has had gradual onset of left ear pain, sneezing, runny nose, nasal congestion, sore throat, pleuritic chest pain, sob and cough productive with yellow sputum. Onset was gradual, persistent, worsening, moderate in severity, with no relief after using over-the-counter medication. He denies fever, chills, hemoptysis, cp, nausea, vomiting diarrhea, abdominal pain, or leg swelling.  No rash.  Pt sts his HIV is under controlled with his regular meds.  Does not recall last CD4 count or viral load. No recent sick contact, weight changes, or myalgias.    Past Medical History  Diagnosis Date  . Hypertension   . HIV positive     Past Surgical History  Procedure Date  . Wrist surgery     right  . Hip surgery     left    No family history on file.  History  Substance Use Topics  . Smoking status: Current Some Day Smoker  . Smokeless tobacco: Not on file  . Alcohol Use: No      Review of Systems  All other systems reviewed and are negative.    Allergies  Review of patient's allergies indicates no known allergies.  Home Medications   Current Outpatient Rx  Name  Route  Sig  Dispense  Refill  . ASPIRIN EFFERVESCENT 325 MG PO TBEF   Oral   Take 325 mg by mouth every 6 (six) hours as needed. For cold symptoms         . IBUPROFEN 200 MG PO TABS   Oral   Take 200 mg by mouth every 6 (six) hours as needed. For pain           BP 153/89  Pulse 86  Temp 98 F (36.7 C) (Oral)  Resp 16  SpO2 97%  Physical Exam  Nursing note and vitals reviewed. Constitutional: He appears  well-developed and well-nourished. No distress.       Awake, alert, nontoxic appearance  HENT:  Head: Atraumatic.       Cerumen in ears bilaterally.  Rhinorrhea, mild post oropharyngeal erythema without evidence of deep tissue infection.  Uvula midline.   Eyes: Conjunctivae normal are normal. Right eye exhibits no discharge. Left eye exhibits no discharge.  Neck: Normal range of motion. Neck supple.  Cardiovascular: Normal rate and regular rhythm.   Pulmonary/Chest: Effort normal. No respiratory distress. He has no wheezes. He has no rales. He exhibits no tenderness.  Abdominal: Soft. There is no tenderness. There is no rebound.  Musculoskeletal: He exhibits no edema and no tenderness.       ROM appears intact, no obvious focal weakness  Neurological: He is alert.  Skin: Skin is warm and dry. No rash noted.  Psychiatric: He has a normal mood and affect.    ED Course  Procedures (including critical care time)  Labs Reviewed - No data to display No results found.   Dg Chest 2 View  03/07/2012  *RADIOLOGY REPORT*  Clinical Data: Cough, shortness of breath.  CHEST - 2 VIEW  Comparison: 02/20/2011  Findings: Mildly prominent cardiomediastinal contours with aortic tortuosity, similar to prior.  Mild central vascular congestion.  Central peribronchial thickening is similar to prior.  No confluent airspace opacity, pleural effusion, or pneumothorax.  No acute osseous finding.  IMPRESSION: Prominent cardiomediastinal contours are similar to prior.  Chronic versus acute on chronic bronchitic changes.   Original Report Authenticated By: Jearld Lesch, M.D.     1. URI 2. Bronchitis   MDM  HIV+ pt with URI sxs.  He is afebrile,  VSS.  Xray ordered.  Pt c/o L ear pain.  Unable to fully visualized TM due to cerumen impaction.  Antipyrine benzocaine ear drops ordered.  Lortab for cough.    11:29 PM cxry shows bronchitis.  VSS, afebrile.  Since pt has hx of HIV and is a smoker, will d/c with  zithromax.  Cough medication given.  F/u with PCP.  Pt voice understanding and agrees with plan.    BP 153/89  Pulse 86  Temp 98 F (36.7 C) (Oral)  Resp 16  SpO2 97%  I have reviewed nursing notes and vital signs. I personally reviewed the imaging tests through PACS system  I reviewed available ER/hospitalization records thought the EMR       Fayrene Helper, [place] 03/07/12 2330

## 2012-03-08 NOTE — ED Provider Notes (Signed)
Medical screening examination/treatment/procedure(s) were performed by non-physician practitioner and as supervising physician I was immediately available for consultation/collaboration.   Carleene Cooper III, MD 03/08/12 [phone]

## 2013-11-03 ENCOUNTER — Encounter: Payer: Self-pay | Admitting: Gastroenterology

## 2014-01-04 ENCOUNTER — Encounter (HOSPITAL_COMMUNITY): Payer: Self-pay | Admitting: Emergency Medicine

## 2014-01-04 ENCOUNTER — Emergency Department (HOSPITAL_COMMUNITY)
Admission: EM | Admit: 2014-01-04 | Discharge: 2014-01-04 | Disposition: A | Discharge status: Home / Self Care | Payer: Non-veteran care | Attending: Emergency Medicine | Admitting: Emergency Medicine

## 2014-01-04 ENCOUNTER — Emergency Department (HOSPITAL_COMMUNITY): Payer: Non-veteran care

## 2014-01-04 DIAGNOSIS — K5903 Drug induced constipation: Secondary | ICD-10-CM

## 2014-01-04 DIAGNOSIS — Z720 Tobacco use: Secondary | ICD-10-CM | POA: Diagnosis not present

## 2014-01-04 DIAGNOSIS — R109 Unspecified abdominal pain: Secondary | ICD-10-CM

## 2014-01-04 DIAGNOSIS — Z9889 Other specified postprocedural states: Secondary | ICD-10-CM | POA: Diagnosis not present

## 2014-01-04 DIAGNOSIS — R10A1 Flank pain, right side: Secondary | ICD-10-CM

## 2014-01-04 DIAGNOSIS — T3995XA Adverse effect of unspecified nonopioid analgesic, antipyretic and antirheumatic, initial encounter: Secondary | ICD-10-CM | POA: Diagnosis not present

## 2014-01-04 DIAGNOSIS — Z21 Asymptomatic human immunodeficiency virus [HIV] infection status: Secondary | ICD-10-CM | POA: Diagnosis not present

## 2014-01-04 DIAGNOSIS — I10 Essential (primary) hypertension: Secondary | ICD-10-CM | POA: Insufficient documentation

## 2014-01-04 DIAGNOSIS — K5900 Constipation, unspecified: Secondary | ICD-10-CM | POA: Diagnosis not present

## 2014-01-04 LAB — CBC WITH DIFFERENTIAL/PLATELET
Basophils Absolute: 0 10*3/uL (ref 0.0–0.1)
Basophils Relative: 0 % (ref 0–1)
EOS PCT: 1 % (ref 0–5)
Eosinophils Absolute: 0 10*3/uL (ref 0.0–0.7)
HCT: 43.1 % (ref 39.0–52.0)
HEMOGLOBIN: 14.5 g/dL (ref 13.0–17.0)
LYMPHS ABS: 1.5 10*3/uL (ref 0.7–4.0)
LYMPHS PCT: 27 % (ref 12–46)
MCH: 29.4 pg (ref 26.0–34.0)
MCHC: 33.6 g/dL (ref 30.0–36.0)
MCV: 87.2 fL (ref 78.0–100.0)
MONO ABS: 0.4 10*3/uL (ref 0.1–1.0)
MONOS PCT: 8 % (ref 3–12)
NEUTROS ABS: 3.6 10*3/uL (ref 1.7–7.7)
Neutrophils Relative %: 64 % (ref 43–77)
Platelets: 226 10*3/uL (ref 150–400)
RBC: 4.94 MIL/uL (ref 4.22–5.81)
RDW: 14.8 % (ref 11.5–15.5)
WBC: 5.6 10*3/uL (ref 4.0–10.5)

## 2014-01-04 LAB — COMPREHENSIVE METABOLIC PANEL
ALT: 27 U/L (ref 0–53)
AST: 23 U/L (ref 0–37)
Albumin: 3.7 g/dL (ref 3.5–5.2)
Alkaline Phosphatase: 60 U/L (ref 39–117)
Anion gap: 9 (ref 5–15)
BILIRUBIN TOTAL: 0.6 mg/dL (ref 0.3–1.2)
BUN: 13 mg/dL (ref 6–23)
CALCIUM: 9.6 mg/dL (ref 8.4–10.5)
CHLORIDE: 103 meq/L (ref 96–112)
CO2: 25 meq/L (ref 19–32)
CREATININE: 1.04 mg/dL (ref 0.50–1.35)
GFR, EST AFRICAN AMERICAN: 88 mL/min — AB (ref >90)
GFR, EST NON AFRICAN AMERICAN: 76 mL/min — AB (ref >90)
GLUCOSE: 93 mg/dL (ref 70–99)
Potassium: 4.5 mEq/L (ref 3.7–5.3)
Sodium: 137 mEq/L (ref 137–147)
Total Protein: 7.4 g/dL (ref 6.0–8.3)

## 2014-01-04 LAB — URINE MICROSCOPIC-ADD ON

## 2014-01-04 LAB — URINALYSIS, ROUTINE W REFLEX MICROSCOPIC
Bilirubin Urine: NEGATIVE
Glucose, UA: NEGATIVE mg/dL
Ketones, ur: NEGATIVE mg/dL
LEUKOCYTES UA: NEGATIVE
Nitrite: NEGATIVE
PROTEIN: NEGATIVE mg/dL
Specific Gravity, Urine: 1.026 (ref 1.005–1.030)
UROBILINOGEN UA: 1 mg/dL (ref 0.0–1.0)
pH: 5.5 (ref 5.0–8.0)

## 2014-01-04 MED ORDER — ONDANSETRON HCL 4 MG/2ML IJ SOLN
4.0000 mg | Freq: Once | INTRAMUSCULAR | Status: AC
  Administered 2014-01-04: 4 mg via INTRAVENOUS
  Filled 2014-01-04: qty 2

## 2014-01-04 MED ORDER — POLYETHYLENE GLYCOL 3350 17 G PO PACK
17.0000 g | PACK | Freq: Every day | ORAL | Status: DC | PRN
Start: 2014-01-04 — End: 2017-01-23

## 2014-01-04 MED ORDER — BISACODYL 5 MG PO TBEC: 5.0000 mg | DELAYED_RELEASE_TABLET | Freq: Two times a day (BID) | ORAL | Status: DC

## 2014-01-04 MED ORDER — IOHEXOL 300 MG/ML  SOLN
50.0000 mL | Freq: Once | INTRAMUSCULAR | Status: AC | PRN
  Administered 2014-01-04: 50 mL via ORAL

## 2014-01-04 MED ORDER — IOHEXOL 300 MG/ML  SOLN
100.0000 mL | Freq: Once | INTRAMUSCULAR | Status: AC | PRN
  Administered 2014-01-04: 100 mL via INTRAVENOUS

## 2014-01-04 MED ORDER — HYDROMORPHONE HCL 1 MG/ML IJ SOLN
1.0000 mg | Freq: Once | INTRAMUSCULAR | Status: AC
  Administered 2014-01-04: 1 mg via INTRAVENOUS
  Filled 2014-01-04: qty 1
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
## 2014-01-04 NOTE — ED Notes (Signed)
Pt returned from CT °

## 2014-01-04 NOTE — ED Notes (Signed)
Per pt, states he had a prostate biopsy done last Thurs-woke up with right abdominal pain on Sat morning-nauseated, no vomiting

## 2014-01-04 NOTE — Progress Notes (Signed)
  CARE MANAGEMENT ED NOTE 01/04/2014  Patient:  Alexander Nicholson, Alexander Nicholson   Account Number:  [id number]  Date Initiated:  01/04/2014  Documentation initiated by:  Livia Snellen  Subjective/Objective Assessment:   Patient presents to Ed post prostate biopsy last Thursday, abdominal pain and nausea noted on Saturday     Subjective/Objective Assessment Detail:   Patient with pmhx of HTN, HIV posistive     Action/Plan:   Action/Plan Detail:   Anticipated DC Date:       Status Recommendation to Physician:   Result of Recommendation:    Other ED [place]  Other  PCP issues    Choice offered to / List presented to:            Status of service:  Completed, signed off  ED Comments:   ED Comments Detail:  EDCM spoke to patient at bedside.  Patient confirms his pcp is located at the [facility] in [place] reports he has just gotten a new pcp and cannot remember his name at this time.  System updated.

## 2014-01-05 NOTE — ED Provider Notes (Signed)
Medical screening examination/treatment/procedure(s) were performed by non-physician practitioner and as supervising physician I was immediately available for consultation/collaboration.   EKG Interpretation None        Evelina Bucy, MD 01/05/14 [phone]

## 2014-01-06 ENCOUNTER — Telehealth: Payer: Self-pay | Admitting: Gastroenterology

## 2014-01-06 ENCOUNTER — Ambulatory Visit: Payer: Non-veteran care | Admitting: Gastroenterology

## 2014-01-06 NOTE — Telephone Encounter (Signed)
No charge. 

## 2014-03-24 ENCOUNTER — Ambulatory Visit: Payer: Non-veteran care | Admitting: Gastroenterology

## 2014-05-07 ENCOUNTER — Ambulatory Visit: Payer: Non-veteran care | Admitting: Gastroenterology

## 2014-05-11 ENCOUNTER — Ambulatory Visit: Payer: Non-veteran care | Admitting: Gastroenterology

## 2014-06-28 ENCOUNTER — Ambulatory Visit: Payer: Non-veteran care | Admitting: Gastroenterology

## 2015-09-08 ENCOUNTER — Encounter (HOSPITAL_COMMUNITY): Payer: Self-pay

## 2015-09-08 ENCOUNTER — Ambulatory Visit (HOSPITAL_COMMUNITY)
Admission: EM | Admit: 2015-09-08 | Discharge: 2015-09-08 | Disposition: A | Discharge status: Home / Self Care | Payer: Non-veteran care | Attending: Family Medicine | Admitting: Family Medicine

## 2015-09-08 DIAGNOSIS — M542 Cervicalgia: Secondary | ICD-10-CM | POA: Diagnosis not present

## 2015-09-08 MED ORDER — CEPHALEXIN 500 MG PO CAPS
500.0000 mg | ORAL_CAPSULE | Freq: Two times a day (BID) | ORAL | Status: DC
Start: 2015-09-08 — End: 2017-11-13

## 2015-09-08 MED ORDER — CYCLOBENZAPRINE HCL 10 MG PO TABS: 10.0000 mg | ORAL_TABLET | Freq: Two times a day (BID) | ORAL | Status: DC | PRN

## 2015-09-08 NOTE — Discharge Instructions (Signed)

## 2015-09-08 NOTE — ED Notes (Signed)
Patient states he has a possible spider bite on his back of rt thigh and on upper left arm and he is experiencing pain in back of head radiating towards neck and shoulders x3 days, pt has taken hydrocodone for pain   no acute distress

## 2015-09-08 NOTE — ED Notes (Signed)
Pt d/c by Frank P, PA 

## 2015-09-08 NOTE — ED Provider Notes (Signed)
CSN: [id number]     Arrival date & time 09/08/15  1904 History   First MD Initiated Contact with Patient 09/08/15 1945     Chief Complaint  Patient presents with  . Insect Bite   (Consider location/radiation/quality/duration/timing/severity/associated sxs/prior Treatment) HPI History obtained from patient: Location: Neck pain  Context/Duration: 2 days ago, awoke with pain and stiffness  Severity: 2  Quality:stiffness Timing:           constant Home Treatment: none Associated symptoms:  Insect bites left arm and right thigh Family History:HTN, DM    Past Medical History  Diagnosis Date  . Hypertension   . HIV positive [facility])    Past Surgical History  Procedure Laterality Date  . Wrist surgery      right  . Hip surgery      left   No family history on file. Social History  Substance Use Topics  . Smoking status: Current Some Day Smoker -- 0.50 packs/day    Types: Cigarettes  . Smokeless tobacco: Never Used  . Alcohol Use: Yes     Comment: occ    Review of Systems  Denies: HEADACHE, NAUSEA, ABDOMINAL PAIN, CHEST PAIN, CONGESTION, DYSURIA, SHORTNESS OF BREATH   Allergies  Review of patient's allergies indicates no known allergies.  Home Medications   Prior to Admission medications   Medication Sig Start Date End Date Taking? Authorizing Provider  bisacodyl (DULCOLAX) 5 MG EC tablet Take 1 tablet (5 mg total) by mouth 2 (two) times daily. Until stools normal 01/04/14  Yes Noland Fordyce, PA-C  polyethylene glycol (MIRALAX / GLYCOLAX) packet Take 17 g by mouth daily as needed for moderate constipation. 01/04/14  Yes Noland Fordyce, PA-C  cephALEXin (KEFLEX) 500 MG capsule Take 1 capsule (500 mg total) by mouth 2 (two) times daily. 09/08/15   Konrad Felix, PA  cyclobenzaprine (FLEXERIL) 10 MG tablet Take 1 tablet (10 mg total) by mouth 2 (two) times daily as needed for muscle spasms. 09/08/15   Konrad Felix, PA  ibuprofen (ADVIL,MOTRIN) 200 MG tablet Take 200 mg by  mouth every 6 (six) hours as needed for moderate pain (pain). For pain    Historical Provider, MD   Meds Ordered and Administered this Visit  Medications - No data to display  BP 124/82 mmHg  Pulse 71  Temp(Src) 98.4 F (36.9 C) (Oral)  Resp 18  SpO2 100% No data found.   Physical Exam NURSES NOTES AND VITAL SIGNS REVIEWED. CONSTITUTIONAL: Well developed, well nourished, no acute distress HEENT: normocephalic, atraumatic, EYES: Conjunctiva normal NECK:normal ROM, supple, no adenopathy, muscle spasm palpable.  PULMONARY:No respiratory distress, normal effort ABDOMINAL: Soft, ND, NT BS+, No CVAT MUSCULOSKELETAL: Normal ROM of all extremities, , 2 small non tender red lesions upper left arm and right thigh.no abscess.  SKIN: warm and dry without rash PSYCHIATRIC: Mood and affect, behavior are normal  ED Course  Procedures (including critical care time)  Labs Review Labs Reviewed - No data to display  Imaging Review No results found.   Visual Acuity Review  Right Eye Distance:   Left Eye Distance:   Bilateral Distance:    Right Eye Near:   Left Eye Near:    Bilateral Near:      Rx: flexeril and keflex   MDM   1. Neck pain     Patient is reassured that there are no issues that require transfer to higher level of care at this time or additional tests. Patient is advised to  continue home symptomatic treatment. Patient is advised that if there are new or worsening symptoms to attend the emergency department, contact primary care provider, or return to UC. Instructions of care provided discharged home in stable condition.    THIS NOTE WAS GENERATED USING A VOICE RECOGNITION SOFTWARE PROGRAM. ALL REASONABLE EFFORTS  WERE MADE TO PROOFREAD THIS DOCUMENT FOR ACCURACY.  I have verbally reviewed the discharge instructions with the patient. A printed AVS was given to the patient.  All questions were answered prior to discharge.      Konrad Felix, PA 09/08/15  2008

## 2016-11-09 ENCOUNTER — Encounter (HOSPITAL_COMMUNITY): Payer: Self-pay

## 2016-11-09 ENCOUNTER — Emergency Department (HOSPITAL_COMMUNITY)
Admission: EM | Admit: 2016-11-09 | Discharge: 2016-11-09 | Disposition: A | Discharge status: Home / Self Care | Payer: Non-veteran care | Attending: Emergency Medicine | Admitting: Emergency Medicine

## 2016-11-09 ENCOUNTER — Emergency Department (HOSPITAL_COMMUNITY): Payer: Non-veteran care

## 2016-11-09 DIAGNOSIS — J0100 Acute maxillary sinusitis, unspecified: Secondary | ICD-10-CM | POA: Insufficient documentation

## 2016-11-09 DIAGNOSIS — I10 Essential (primary) hypertension: Secondary | ICD-10-CM | POA: Insufficient documentation

## 2016-11-09 DIAGNOSIS — F17210 Nicotine dependence, cigarettes, uncomplicated: Secondary | ICD-10-CM | POA: Insufficient documentation

## 2016-11-09 DIAGNOSIS — R509 Fever, unspecified: Secondary | ICD-10-CM

## 2016-11-09 DIAGNOSIS — Z8546 Personal history of malignant neoplasm of prostate: Secondary | ICD-10-CM | POA: Insufficient documentation

## 2016-11-09 DIAGNOSIS — Z79899 Other long term (current) drug therapy: Secondary | ICD-10-CM | POA: Insufficient documentation

## 2016-11-09 HISTORY — DX: Malignant (primary) neoplasm, unspecified: C80.1

## 2016-11-09 LAB — BASIC METABOLIC PANEL
Anion gap: 6 (ref 5–15)
BUN: 15 mg/dL (ref 6–20)
CO2: 26 mmol/L (ref 22–32)
Calcium: 9.5 mg/dL (ref 8.9–10.3)
Chloride: 106 mmol/L (ref 101–111)
Creatinine, Ser: 1.17 mg/dL (ref 0.61–1.24)
GFR calc Af Amer: >60 mL/min (ref >60)
Glucose, Bld: 106 mg/dL — ABNORMAL HIGH (ref 65–99)
Potassium: 3.9 mmol/L (ref 3.5–5.1)
Sodium: 138 mmol/L (ref 135–145)

## 2016-11-09 LAB — CBC
HCT: 34.7 % — ABNORMAL LOW (ref 39.0–52.0)
Hemoglobin: 11.9 g/dL — ABNORMAL LOW (ref 13.0–17.0)
MCH: 29.3 pg (ref 26.0–34.0)
MCHC: 34.3 g/dL (ref 30.0–36.0)
MCV: 85.5 fL (ref 78.0–100.0)
Platelets: 196 10*3/uL (ref 150–400)
RBC: 4.06 MIL/uL — ABNORMAL LOW (ref 4.22–5.81)
RDW: 14.8 % (ref 11.5–15.5)
WBC: 4.5 10*3/uL (ref 4.0–10.5)

## 2016-11-09 LAB — POCT I-STAT TROPONIN I: Troponin i, poc: 0 ng/mL (ref 0.00–0.08)

## 2016-11-09 MED ORDER — ACETAMINOPHEN 325 MG PO TABS
650.0000 mg | ORAL_TABLET | Freq: Once | ORAL | Status: AC
  Administered 2016-11-09: 650 mg via ORAL
  Filled 2016-11-09: qty 2

## 2016-11-09 MED ORDER — AMOXICILLIN 500 MG PO CAPS
500.0000 mg | ORAL_CAPSULE | Freq: Once | ORAL | Status: AC
  Administered 2016-11-09: 500 mg via ORAL
  Filled 2016-11-09: qty 1

## 2016-11-09 MED ORDER — IBUPROFEN 800 MG PO TABS
800.0000 mg | ORAL_TABLET | Freq: Once | ORAL | Status: AC
  Administered 2016-11-09: 800 mg via ORAL
  Filled 2016-11-09: qty 1

## 2016-11-09 MED ORDER — PROMETHAZINE-DM 6.25-15 MG/5ML PO SYRP: 5.0000 mL | ORAL_SOLUTION | Freq: Four times a day (QID) | ORAL | 0 refills | Status: DC | PRN

## 2016-11-09 MED ORDER — AMOXICILLIN 500 MG PO CAPS: 500.0000 mg | ORAL_CAPSULE | Freq: Three times a day (TID) | ORAL | 0 refills | Status: DC

## 2016-11-09 NOTE — ED Provider Notes (Signed)
[facility] DEPT Provider Note   CSN: [id number] Arrival date & time: 11/09/16  1510     History   Chief Complaint Chief Complaint  Patient presents with  . Chest Pain    HPI Alexander Nicholson is a 61 y.o. male. Chief complaint is cough, fever, and chest pain.  HPI 61 year old male with a two-day illness. He was at the [facility] for routine testing is in complaint of a cough and fever. He states he had an x-ray obtained and there is "supposed to call me".  States he has a frontal headache. Hurts in his left maxilla in his left teeth. He has a lot of "thick stuff" feels as draining from his mouth to his throat is coughing up a lot of sputum. No blood. No short of breath "unless I get into that coughing".  Past Medical History:  Diagnosis Date  . Cancer [facility]) 2013   prostate state 4  . HIV positive ([place])   . Hypertension     There are no active problems to display for this patient.   Past Surgical History:  Procedure Laterality Date  . HIP SURGERY     left  . WRIST SURGERY     right       Home Medications    Prior to Admission medications   Medication Sig Start Date End Date Taking? Authorizing Provider  amoxicillin (AMOXIL) 500 MG capsule Take 1 capsule (500 mg total) by mouth 3 (three) times daily. 11/09/16   Tanna Furry, MD  bisacodyl (DULCOLAX) 5 MG EC tablet Take 1 tablet (5 mg total) by mouth 2 (two) times daily. Until stools normal 01/04/14   Leeroy Cha O, PA-C  cephALEXin (KEFLEX) 500 MG capsule Take 1 capsule (500 mg total) by mouth 2 (two) times daily. 09/08/15   Konrad Felix, PA  cyclobenzaprine (FLEXERIL) 10 MG tablet Take 1 tablet (10 mg total) by mouth 2 (two) times daily as needed for muscle spasms. 09/08/15   Konrad Felix, PA  ibuprofen (ADVIL,MOTRIN) 200 MG tablet Take 200 mg by mouth every 6 (six) hours as needed for moderate pain (pain). For pain    [provider]  polyethylene glycol (MIRALAX / GLYCOLAX) packet Take 17 g by mouth  daily as needed for moderate constipation. 01/04/14   Noe Gens, PA-C  promethazine-dextromethorphan (PROMETHAZINE-DM) 6.25-15 MG/5ML syrup Take 5 mLs by mouth 4 (four) times daily as needed for cough. 11/09/16   Tanna Furry, MD    Family History No family history on file.  Social History Social History  Substance Use Topics  . Smoking status: Current Some Day Smoker    Packs/day: 0.50    Types: Cigarettes  . Smokeless tobacco: Never Used  . Alcohol use Yes     Comment: occ     Allergies   Patient has no known allergies.   Review of Systems Review of Systems  Constitutional: Positive for fatigue and fever. Negative for appetite change, chills and diaphoresis.  HENT: Positive for congestion, dental problem and sinus pain. Negative for mouth sores, sore throat and trouble swallowing.   Eyes: Negative for visual disturbance.  Respiratory: Positive for cough. Negative for chest tightness, shortness of breath and wheezing.   Cardiovascular: Negative for chest pain.  Gastrointestinal: Negative for abdominal distention, abdominal pain, diarrhea, nausea and vomiting.  Endocrine: Negative for polydipsia, polyphagia and polyuria.  Genitourinary: Negative for dysuria, frequency and hematuria.  Musculoskeletal: Negative for gait problem.  Skin: Negative for color change, pallor  and rash.  Neurological: Negative for dizziness, syncope, light-headedness and headaches.  Hematological: Does not bruise/bleed easily.  Psychiatric/Behavioral: Negative for behavioral problems and confusion.     Physical Exam Updated Vital Signs BP 138/87 (BP Location: Right Arm)   Pulse 70   Temp 100 F (37.8 C) (Oral)   Resp 18   Ht 5\' 11"  (1.803 m)   Wt 89.8 kg (198 lb)   SpO2 98%   BMI 27.62 kg/m   Physical Exam  Constitutional: He is oriented to person, place, and time. He appears well-developed and well-nourished. No distress.  HENT:  Head: Normocephalic.  Tender to palpate over the  left maxilla. Pharynx benign. Nares clear.  Eyes: Pupils are equal, round, and reactive to light. Conjunctivae are normal. No scleral icterus.  Neck: Normal range of motion. Neck supple. No thyromegaly present.  Cardiovascular: Normal rate and regular rhythm.  Exam reveals no gallop and no friction rub.   No murmur heard. Pulmonary/Chest: Effort normal and breath sounds normal. No respiratory distress. He has no wheezes. He has no rales.  Clear breath sounds.  Abdominal: Soft. Bowel sounds are normal. He exhibits no distension. There is no tenderness. There is no rebound.  Musculoskeletal: Normal range of motion.  Neurological: He is alert and oriented to person, place, and time.  Skin: Skin is warm and dry. No rash noted.  Psychiatric: He has a normal mood and affect. His behavior is normal.   Recheck temp by me 102.2.  ED Treatments / Results  Labs (all labs ordered are listed, but only abnormal results are displayed) Labs Reviewed  BASIC METABOLIC PANEL - Abnormal; Notable for the following:       Result Value   Glucose, Bld 106 (*)    All other components within normal limits  CBC - Abnormal; Notable for the following:    RBC 4.06 (*)    Hemoglobin 11.9 (*)    HCT 34.7 (*)    All other components within normal limits  I-STAT TROPONIN, ED  POCT I-STAT TROPONIN I    EKG  EKG Interpretation  Date/Time:  Friday November 09 2016 15:20:28 EDT Ventricular Rate:  86 PR Interval:    QRS Duration: 80 QT Interval:  343 QTC Calculation: 411 R Axis:   45 Text Interpretation:  Sinus rhythm Minimal ST elevation, V3 No reciprocal changes No change vs comparison Confirmed by Tanna Furry [phone]) on 11/09/2016 3:32:01 PM       Radiology Dg Chest 2 View  Result Date: 11/09/2016 CLINICAL DATA:  Chest pain for 2 days.  Short of breath EXAM: CHEST  2 VIEW COMPARISON:  None. FINDINGS: Normal mediastinum and cardiac silhouette. Normal pulmonary vasculature. No evidence of effusion,  infiltrate, or pneumothorax. No acute bony abnormality. IMPRESSION: No acute cardiopulmonary process. Electronically Signed   By: Suzy Bouchard M.D.   On: 11/09/2016 16:37    Procedures Procedures (including critical care time)  Medications Ordered in ED Medications  acetaminophen (TYLENOL) tablet 650 mg (650 mg Oral Given 11/09/16 1927)  ibuprofen (ADVIL,MOTRIN) tablet 800 mg (800 mg Oral Given 11/09/16 1927)  amoxicillin (AMOXIL) capsule 500 mg (500 mg Oral Given 11/09/16 1927)     Initial Impression / Assessment and Plan / ED Course  I have reviewed the triage vital signs and the nursing notes.  Pertinent labs & imaging results that were available during my care of the patient were reviewed by me and considered in my medical decision making (see chart for details).  this x-ray shows no infiltrates or effusions. Amoxicillin Tylenol here. Plan home treatment. Amoxicillin Tylenol Phenergan DM for cough.  Final Clinical Impressions(s) / ED Diagnoses   Final diagnoses:  Acute maxillary sinusitis, recurrence not specified  Fever, unspecified fever cause    New Prescriptions Discharge Medication List as of 11/09/2016  7:20 PM    START taking these medications   Details  amoxicillin (AMOXIL) 500 MG capsule Take 1 capsule (500 mg total) by mouth 3 (three) times daily., Starting Fri 11/09/2016, Print    promethazine-dextromethorphan (PROMETHAZINE-DM) 6.25-15 MG/5ML syrup Take 5 mLs by mouth 4 (four) times daily as needed for cough., Starting Fri 11/09/2016, Print         Tanna Furry, MD 11/09/16 2037

## 2016-11-09 NOTE — ED Notes (Signed)
Bed: WA07 Expected date:  Expected time:  Means of arrival:  Comments: 

## 2016-11-09 NOTE — ED Triage Notes (Addendum)
Pt c/o chest pain x2 days, shortness of breath, productive cough with thick sputum, and HA. Cheat pain worse with cough. Pt been taking mucinex.Pt states feeling lightheaded when standing up this morning.

## 2016-11-09 NOTE — Discharge Instructions (Signed)
Motrin and/or Tylenol for fever. Cough medicine may cause sleepiness. No driving. Amoxicillin for her sinus infection.

## 2017-01-23 ENCOUNTER — Ambulatory Visit (INDEPENDENT_AMBULATORY_CARE_PROVIDER_SITE_OTHER): Payer: Non-veteran care | Admitting: Neurology

## 2017-01-23 ENCOUNTER — Encounter: Payer: Self-pay | Admitting: Neurology

## 2017-01-23 VITALS — BP 142/103 | HR 75 | Ht 71.0 in | Wt 196.0 lb

## 2017-01-23 DIAGNOSIS — R29818 Other symptoms and signs involving the nervous system: Secondary | ICD-10-CM | POA: Diagnosis not present

## 2017-01-23 DIAGNOSIS — R251 Tremor, unspecified: Secondary | ICD-10-CM

## 2017-01-23 MED ORDER — ALPRAZOLAM 1 MG PO TABS: ORAL_TABLET | ORAL | 0 refills | Status: DC

## 2017-01-23 MED ORDER — PROPRANOLOL HCL 40 MG PO TABS: 40.0000 mg | ORAL_TABLET | Freq: Two times a day (BID) | ORAL | 11 refills | Status: DC
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
## 2017-02-13 ENCOUNTER — Telehealth: Payer: Self-pay | Admitting: Neurology

## 2017-02-13 ENCOUNTER — Other Ambulatory Visit: Payer: Non-veteran care

## 2017-02-13 NOTE — Telephone Encounter (Signed)
I spoke to Dr. Sherral Hammers and the [facility] and he asked for me to fax Dr. Krista Blue clinical notes for him to get the patients MRI set up through the [facility] I faxed the notes to [phone]. Dr. Sherral Hammers phone number is [phone] and his nurse's name is Pam.

## 2017-04-01 ENCOUNTER — Ambulatory Visit: Payer: Non-veteran care | Admitting: Neurology

## 2017-05-22 ENCOUNTER — Ambulatory Visit: Payer: Non-veteran care | Admitting: Neurology

## 2017-08-26 ENCOUNTER — Ambulatory Visit: Payer: Non-veteran care | Admitting: Neurology

## 2017-08-26 ENCOUNTER — Telehealth: Payer: Self-pay | Admitting: *Deleted

## 2017-08-26 NOTE — Telephone Encounter (Signed)
No showed follow up appointment. 

## 2017-08-27 ENCOUNTER — Encounter: Payer: Self-pay | Admitting: Neurology

## 2017-09-12 ENCOUNTER — Emergency Department (HOSPITAL_COMMUNITY)
Admission: EM | Admit: 2017-09-12 | Discharge: 2017-09-12 | Disposition: A | Discharge status: Home / Self Care | Payer: Non-veteran care | Attending: Emergency Medicine | Admitting: Emergency Medicine

## 2017-09-12 ENCOUNTER — Other Ambulatory Visit: Payer: Self-pay

## 2017-09-12 ENCOUNTER — Encounter (HOSPITAL_COMMUNITY): Payer: Self-pay | Admitting: Emergency Medicine

## 2017-09-12 DIAGNOSIS — H9203 Otalgia, bilateral: Secondary | ICD-10-CM | POA: Diagnosis not present

## 2017-09-12 DIAGNOSIS — Z87891 Personal history of nicotine dependence: Secondary | ICD-10-CM | POA: Diagnosis not present

## 2017-09-12 DIAGNOSIS — Z8546 Personal history of malignant neoplasm of prostate: Secondary | ICD-10-CM | POA: Diagnosis not present

## 2017-09-12 DIAGNOSIS — I10 Essential (primary) hypertension: Secondary | ICD-10-CM | POA: Insufficient documentation

## 2017-09-12 DIAGNOSIS — H9209 Otalgia, unspecified ear: Secondary | ICD-10-CM

## 2017-09-12 DIAGNOSIS — Z79899 Other long term (current) drug therapy: Secondary | ICD-10-CM | POA: Insufficient documentation

## 2017-09-12 MED ORDER — SODIUM CHLORIDE 0.9 % IJ SOLN
INTRAMUSCULAR | Status: AC
  Filled 2017-09-12: qty 10

## 2017-09-12 MED ORDER — IBUPROFEN 600 MG PO TABS
600.0000 mg | ORAL_TABLET | Freq: Four times a day (QID) | ORAL | 0 refills | Status: DC | PRN
Start: 2017-09-12 — End: 2017-11-13

## 2017-09-12 MED ORDER — AMOXICILLIN 500 MG PO CAPS: 500.0000 mg | ORAL_CAPSULE | Freq: Three times a day (TID) | ORAL | 0 refills | Status: DC

## 2017-09-12 NOTE — ED Provider Notes (Signed)
[facility] DEPT Provider Note   CSN: [id number] Arrival date & time: 09/12/17  0451     History   Chief Complaint Chief Complaint  Patient presents with  . Otalgia  . Headache    HPI Alexander Nicholson is a 62 y.o. male.  62 year old male with a history of hypertension, stage IV prostate cancer, HIV presents to the ED for complaints of bilateral otalgia.  He states that symptoms have been ongoing for the past 3 days.  He has had similar symptoms intermittently since the 1970s.  Discomfort is worse on the right side.  He has had relief in the past with application of peroxide topically in the ear canal.  He has tried this without relief.  The patient has also irrigated his ear canals bilaterally which helped some on the left, but his right ear pain has persisted.  No medications taken by mouth for pain control.  No fevers.  The patient is not followed by an ENT specialist.     Past Medical History:  Diagnosis Date  . Cancer [facility]) 2013   prostate state 4  . HIV positive ([place])   . Hypertension   . PTSD (post-traumatic stress disorder)     Patient Active Problem List   Diagnosis Date Noted  . Tremor of left hand 01/23/2017  . Other symptoms and signs involving the nervous system 01/23/2017    Past Surgical History:  Procedure Laterality Date  . FEMUR FRACTURE SURGERY    . HERNIA REPAIR     Umbilical  . HIP SURGERY     left  . SPLENECTOMY    . WRIST SURGERY     right        Home Medications    Prior to Admission medications   Medication Sig Start Date End Date Taking? Authorizing Provider  ALPRAZolam Duanne Moron) 1 MG tablet One as needed before MRI, may repeat if needed 01/23/17   Marcial Pacas, MD  amoxicillin (AMOXIL) 500 MG capsule Take 1 capsule (500 mg total) by mouth 3 (three) times daily. 09/12/17   Antonietta Breach, PA-C  bisacodyl (DULCOLAX) 5 MG EC tablet Take 1 tablet (5 mg total) by mouth 2 (two) times daily. Until stools normal  01/04/14   Leeroy Cha O, PA-C  cephALEXin (KEFLEX) 500 MG capsule Take 1 capsule (500 mg total) by mouth 2 (two) times daily. 09/08/15   Konrad Felix, PA  cyclobenzaprine (FLEXERIL) 10 MG tablet Take 1 tablet (10 mg total) by mouth 2 (two) times daily as needed for muscle spasms. 09/08/15   Konrad Felix, PA  ibuprofen (ADVIL,MOTRIN) 600 MG tablet Take 1 tablet (600 mg total) by mouth every 6 (six) hours as needed. 09/12/17   Antonietta Breach, PA-C  propranolol (INDERAL) 40 MG tablet Take 1 tablet (40 mg total) 2 (two) times daily by mouth. 01/23/17   Marcial Pacas, MD    Family History Family History  Problem Relation Age of Onset  . Cancer Mother   . Prostate cancer Father     Social History Social History   Tobacco Use  . Smoking status: Former Smoker    Packs/day: 0.50    Types: Cigarettes  . Smokeless tobacco: Never Used  . Tobacco comment: Quit ~1 year ago  Substance Use Topics  . Alcohol use: Yes    Comment: occ  . Drug use: No     Allergies   Patient has no known allergies.   Review of Systems Review of Systems  Ten systems reviewed and are negative for acute change, except as noted in the HPI.    Physical Exam Updated Vital Signs BP 118/80 (BP Location: Right Arm)   Pulse 71   Temp 98 F (36.7 C) (Oral)   Resp 20   Ht 5\' 11"  (1.803 m)   Wt 89.8 kg (198 lb)   SpO2 100%   BMI 27.62 kg/m   Physical Exam  Constitutional: He is oriented to person, place, and time. He appears well-developed and well-nourished. No distress.  Nontoxic appearing and in no distress  HENT:  Head: Normocephalic and atraumatic.  Scant cerumen in the right canal.  Bilateral TMs normal.  No erythema, bulging, retractions.  No middle ear effusion.  There is tenderness to palpation along the superior SCM.  Mild tonsillar adenopathy bilaterally.  No erythema or tenderness to the mastoid process bilaterally.  Oropharynx clear.  Uvula midline.  Patient tolerating secretions without  difficulty.  Symmetric jaw opening.  Eyes: Conjunctivae and EOM are normal. No scleral icterus.  Neck: Normal range of motion.  No meningismus  Pulmonary/Chest: Effort normal. No respiratory distress.  Respirations even and unlabored  Musculoskeletal: Normal range of motion.  Neurological: He is alert and oriented to person, place, and time. He exhibits normal muscle tone. Coordination normal.  Skin: Skin is warm and dry. No rash noted. He is not diaphoretic. No erythema. No pallor.  Psychiatric: He has a normal mood and affect. His behavior is normal.  Nursing note and vitals reviewed.    ED Treatments / Results  Labs (all labs ordered are listed, but only abnormal results are displayed) Labs Reviewed - No data to display  EKG None  Radiology No results found.  Procedures Procedures (including critical care time)  Medications Ordered in ED Medications - No data to display   Initial Impression / Assessment and Plan / ED Course  I have reviewed the triage vital signs and the nursing notes.  Pertinent labs & imaging results that were available during my care of the patient were reviewed by me and considered in my medical decision making (see chart for details).     62 year old male presents to the emergency department for evaluation of otalgia.  He has more tenderness to palpation along the right lateral aspect of the neck.  No meningismus.  Patient afebrile.  He has symmetric jaw opening and reassuring ear exam bilaterally.  He does have some mild lymph node swelling.  Plan to treat for reactive lymphadenopathy with amoxicillin.  Patient given referral to ENT for follow-up.  Return precautions discussed and provided. Patient discharged in stable condition with no unaddressed concerns.   Final Clinical Impressions(s) / ED Diagnoses   Final diagnoses:  Otalgia, unspecified laterality    ED Discharge Orders        Ordered    amoxicillin (AMOXIL) 500 MG capsule  3 times  daily     09/12/17 0545    ibuprofen (ADVIL,MOTRIN) 600 MG tablet  Every 6 hours PRN     09/12/17 0545       Antonietta Breach, PA-C 09/12/17 0616    Palumbo, April, MD 09/12/17 5726

## 2017-09-12 NOTE — ED Triage Notes (Signed)
Patient comes in with complaints of bilateral otalgia, worse on the right. Patient states the pain began three days ago. Patient states he was able to clean out his left ear, which now feels better but his right ear is still painful. Patient states it is making his jaw and head also hurt.

## 2017-09-12 NOTE — Discharge Instructions (Signed)
Given your reassuring exam, we recommend follow-up with ENT.  Call in the morning to schedule an appointment for within the next 3 weeks.  Take amoxicillin as prescribed until finished.  You may take ibuprofen for any persistent discomfort.  Follow-up with your primary care doctor in the interim as needed.

## 2017-11-13 ENCOUNTER — Ambulatory Visit (INDEPENDENT_AMBULATORY_CARE_PROVIDER_SITE_OTHER): Payer: Non-veteran care

## 2017-11-13 ENCOUNTER — Ambulatory Visit (HOSPITAL_COMMUNITY)
Admission: EM | Admit: 2017-11-13 | Discharge: 2017-11-13 | Disposition: A | Discharge status: Home / Self Care | Payer: Self-pay | Attending: Family Medicine | Admitting: Family Medicine

## 2017-11-13 ENCOUNTER — Encounter (HOSPITAL_COMMUNITY): Payer: Self-pay

## 2017-11-13 ENCOUNTER — Other Ambulatory Visit: Payer: Self-pay

## 2017-11-13 ENCOUNTER — Ambulatory Visit (INDEPENDENT_AMBULATORY_CARE_PROVIDER_SITE_OTHER): Payer: Self-pay

## 2017-11-13 DIAGNOSIS — M25531 Pain in right wrist: Secondary | ICD-10-CM | POA: Diagnosis not present

## 2017-11-13 DIAGNOSIS — M47812 Spondylosis without myelopathy or radiculopathy, cervical region: Secondary | ICD-10-CM

## 2017-11-13 DIAGNOSIS — M542 Cervicalgia: Secondary | ICD-10-CM

## 2017-11-13 DIAGNOSIS — G44209 Tension-type headache, unspecified, not intractable: Secondary | ICD-10-CM

## 2017-11-13 DIAGNOSIS — M7918 Myalgia, other site: Secondary | ICD-10-CM

## 2017-11-13 MED ORDER — MELOXICAM 7.5 MG PO TABS: 7.5000 mg | ORAL_TABLET | Freq: Every day | ORAL | 0 refills | Status: AC

## 2017-11-13 MED ORDER — KETOROLAC TROMETHAMINE 30 MG/ML IJ SOLN
INTRAMUSCULAR | Status: AC
  Filled 2017-11-13: qty 1

## 2017-11-13 MED ORDER — KETOROLAC TROMETHAMINE 30 MG/ML IJ SOLN
30.0000 mg | Freq: Once | INTRAMUSCULAR | Status: AC
  Administered 2017-11-13: 30 mg via INTRAMUSCULAR

## 2017-11-13 MED ORDER — CYCLOBENZAPRINE HCL 5 MG PO TABS: 5.0000 mg | ORAL_TABLET | Freq: Every day | ORAL | 0 refills | Status: AC
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
## 2017-11-13 NOTE — Discharge Instructions (Signed)
We gave you a shot of Toradol today  Use anti-inflammatories for pain/swelling. You may take Mobic daily with food. You may supplement with Tylenol [phone] mg every 8 hours.   You may use flexeril as needed to help with pain. This is a muscle relaxer and causes sedation- please use only at bedtime or when you will be home and not have to drive/work  Please continue to monitor your symptoms, if you have worsening weakness, loss of bowel or bladder control, numbness between legs, worsening pain or worsening tingling, please return here go to the emergency room.

## 2017-11-13 NOTE — ED Provider Notes (Signed)
[place]    CSN: [id number] Arrival date & time: 11/13/17  0830     History   Chief Complaint Chief Complaint  Patient presents with  . bodyaches    HPI Alexander Nicholson is a 62 y.o. male history of prostate cancer, HIV positive, hypertension presenting today for evaluation of body aches.  Patient states that on Sunday, 4 days ago he got caught in between elevator doors, his right side was hit by a door that knocked him into the other door on the left side, the doors did not close on him.  After he felt in a days.  Since he has had continued pain, most notably in his bilateral shoulders, noting tingling into his upper extremities, as well as tingling into his head with associated headache.  Also notes to have some tingling into his lower extremities and some lower back pain.  Denies saddle anesthesia-denies numbness with wiping, or loss of bowel or bladder control.  Is also concerned about his right wrist as he has had multiple surgeries and has felt tension in this area.  Denies increased weakness.  He denies worse headache of life, vision changes, difficulty speaking.  Also has history of a rod in his left femur and pinned in his right hip.  HPI  Past Medical History:  Diagnosis Date  . Cancer [facility]) 2013   prostate state 4  . HIV positive ([place])   . Hypertension   . PTSD (post-traumatic stress disorder)     Patient Active Problem List   Diagnosis Date Noted  . Tremor of left hand 01/23/2017  . Other symptoms and signs involving the nervous system 01/23/2017    Past Surgical History:  Procedure Laterality Date  . FEMUR FRACTURE SURGERY    . HERNIA REPAIR     Umbilical  . HIP SURGERY     left  . SPLENECTOMY    . WRIST SURGERY     right       Home Medications    Prior to Admission medications   Medication Sig Start Date End Date Taking? Authorizing Provider  amoxicillin (AMOXIL) 500 MG capsule Take 1 capsule (500 mg total) by mouth 3 (three) times  daily. 09/12/17   Antonietta Breach, PA-C  bisacodyl (DULCOLAX) 5 MG EC tablet Take 1 tablet (5 mg total) by mouth 2 (two) times daily. Until stools normal 01/04/14   Leeroy Cha O, PA-C  cyclobenzaprine (FLEXERIL) 5 MG tablet Take 1 tablet (5 mg total) by mouth at bedtime for 7 days. 11/13/17 11/20/17  Baya Lentz C, PA-C  meloxicam (MOBIC) 7.5 MG tablet Take 1 tablet (7.5 mg total) by mouth daily for 7 days. Take in the morning, with food. 11/13/17 11/20/17  Ryot Burrous C, PA-C  propranolol (INDERAL) 40 MG tablet Take 1 tablet (40 mg total) 2 (two) times daily by mouth. 01/23/17   Marcial Pacas, MD    Family History Family History  Problem Relation Age of Onset  . Cancer Mother   . Prostate cancer Father     Social History Social History   Tobacco Use  . Smoking status: Former Smoker    Packs/day: 0.50    Types: Cigarettes  . Smokeless tobacco: Never Used  . Tobacco comment: Quit ~1 year ago  Substance Use Topics  . Alcohol use: Yes    Comment: occ  . Drug use: No     Allergies   Patient has no known allergies.   Review of Systems Review of Systems  Constitutional: Negative for fatigue and fever.  HENT: Negative for congestion, sinus pressure and sore throat.   Eyes: Negative for photophobia, pain and visual disturbance.  Respiratory: Negative for cough and shortness of breath.   Cardiovascular: Negative for chest pain.  Gastrointestinal: Negative for abdominal pain, nausea and vomiting.  Genitourinary: Negative for decreased urine volume and hematuria.  Musculoskeletal: Positive for arthralgias, back pain, myalgias and neck pain. Negative for neck stiffness.  Neurological: Positive for headaches. Negative for dizziness, syncope, facial asymmetry, speech difficulty, weakness, light-headedness and numbness.     Physical Exam Triage Vital Signs ED Triage Vitals  Enc Vitals Group     BP --      Pulse Rate 11/13/17 0856 73     Resp 11/13/17 0856 16     Temp 11/13/17 0856  97.8 F (36.6 C)     Temp Source 11/13/17 0856 Oral     SpO2 --      Weight 11/13/17 0857 204 lb (92.5 kg)     Height --      Head Circumference --      Peak Flow --      Pain Score 11/13/17 0859 9     Pain Loc --      Pain Edu? --      Excl. in [place]? --    No data found.  Updated Vital Signs Pulse 73   Temp 97.8 F (36.6 C) (Oral)   Resp 16   Wt 204 lb (92.5 kg)   BMI 28.45 kg/m   Visual Acuity Right Eye Distance:   Left Eye Distance:   Bilateral Distance:    Right Eye Near:   Left Eye Near:    Bilateral Near:     Physical Exam  Constitutional: He is oriented to person, place, and time. He appears well-developed and well-nourished.  HENT:  Head: Normocephalic and atraumatic.  Mouth/Throat: Oropharynx is clear and moist.  Eyes: Pupils are equal, round, and reactive to light. Conjunctivae and EOM are normal.  arcus present bilaterally  Neck: Neck supple.  Limited range of motion of neck due to pain, negative Kernig/Brudzinski Tenderness to palpation of bilateral trapezius and sternocleidomastoid musculature.  Cardiovascular: Normal rate and regular rhythm.  No murmur heard. Pulmonary/Chest: Effort normal and breath sounds normal. No respiratory distress.  Abdominal: Soft. There is no tenderness.  Musculoskeletal: He exhibits no edema.  Strength 5/5 and equal bilaterally at shoulders, hips and knees strength 4/5 equal bilaterally, patellar reflexes 2+  Neurological: He is alert and oriented to person, place, and time.  Patient A&O x3, cranial nerves II-XII grossly intact, gait with mild antalgia   Skin: Skin is warm and dry.  Psychiatric: He has a normal mood and affect.  Nursing note and vitals reviewed.    UC Treatments / Results  Labs (all labs ordered are listed, but only abnormal results are displayed) Labs Reviewed - No data to display  EKG None  Radiology Dg Cervical Spine Complete  Result Date: 11/13/2017 CLINICAL DATA:  Closed in elevator door  4 days ago.  Neck pain EXAM: CERVICAL SPINE - COMPLETE 4+ VIEW COMPARISON:  None. FINDINGS: Straightening of the cervical lordosis. Normal alignment. No fracture. Extensive anterior spurring throughout the cervical spine. Disc space narrowing and posterior spurring most notably at C4-5, C5-6, C6-7, and C7-T1 causing foraminal stenosis at these levels due to spurring. IMPRESSION: Moderate to advanced cervical spondylosis throughout the cervical spine. Negative for fracture. Electronically Signed   By: Franchot Gallo M.D.  On: 11/13/2017 10:23   Dg Wrist Complete Right  Result Date: 11/13/2017 CLINICAL DATA:  Right wrist pain and stiffness since the patient's wrist was closed in an elevator door 4 days ago. Initial encounter. EXAM: RIGHT WRIST - COMPLETE 3+ VIEW COMPARISON:  Plain films right wrist 03/05/2004. FINDINGS: Ankylosis of the carpal bones and second and third carpometacarpal joints is again seen. There is severe radiocarpal joint space narrowing. Ulnar minus variance is noted. Both the lunate and scaphoid are flattened and sclerotic. Ulnar minus variance is identified. No acute bony or joint abnormality. Chondrocalcinosis is noted. Soft tissues are otherwise unremarkable. IMPRESSION: No acute abnormality. 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 Electronically Signed   By: Inge Rise M.D.   On: 11/13/2017 10:38    Procedures Procedures (including critical care time)  Medications Ordered in UC Medications  ketorolac (TORADOL) 30 MG/ML injection 30 mg (30 mg Intramuscular Given 11/13/17 1027)    Initial Impression / Assessment and Plan / UC Course  I have reviewed the triage vital signs and the nursing notes.  Pertinent labs & imaging results that were available during my care of the patient were reviewed by me and considered in my medical decision making (see chart for  details).     C-spine showing severe degenerative changes and stenosis, likely causing his tingling bilaterally, likely a similar findings in his lumbar spine causing tingling.  No saddle anesthesia or changes in bowel or bladder habits, cauda equina less likely.  At this time will treat with Toradol in clinic, will send home to continue anti-inflammatories, muscle relaxer.  Continue to monitor symptoms, discussed that he should return if he has worsening weakness or changes in bowel or bladder control or numbness between his legs.  Right wrist,  showing chronic arthritis/fusion.  Will avoid steroids given history of HIV and cancer.Discussed strict return precautions. Patient verbalized understanding and is agreeable with plan.  Final Clinical Impressions(s) / UC Diagnoses   Final diagnoses:  Musculoskeletal pain  Acute non intractable tension-type headache     Discharge Instructions     We gave you a shot of Toradol today  Use anti-inflammatories for pain/swelling. You may take Mobic daily with food. You may supplement with Tylenol [phone] every 8 hours.   You may use flexeril as needed to help with pain. This is a muscle relaxer and causes sedation- please use only at bedtime or when you will be home and not have to drive/work  Please continue to monitor your symptoms, if you have worsening weakness, loss of bowel or bladder control, numbness between legs, worsening pain or worsening tingling, please return here go to the emergency room.    ED Prescriptions    Medication Sig Dispense Auth. Provider   meloxicam (MOBIC) 7.5 MG tablet Take 1 tablet (7.5 mg total) by mouth daily for 7 days. Take in the morning, with food. 7 tablet Caralee Morea C, PA-C   cyclobenzaprine (FLEXERIL) 5 MG tablet Take 1 tablet (5 mg total) by mouth at bedtime for 7 days. 10 tablet Marielis Samara, [place] C, PA-C     Controlled Substance Prescriptions [place] Controlled Substance Registry consulted? Not  Applicable   Janith Lima, [place] 11/13/17 1047

## 2017-12-19 ENCOUNTER — Ambulatory Visit (INDEPENDENT_AMBULATORY_CARE_PROVIDER_SITE_OTHER): Payer: Non-veteran care

## 2017-12-19 ENCOUNTER — Other Ambulatory Visit: Payer: Self-pay

## 2017-12-19 ENCOUNTER — Ambulatory Visit
Admission: EM | Admit: 2017-12-19 | Discharge: 2017-12-19 | Disposition: A | Discharge status: Home / Self Care | Payer: Non-veteran care | Attending: Emergency Medicine | Admitting: Emergency Medicine

## 2017-12-19 ENCOUNTER — Encounter: Payer: Self-pay | Admitting: Emergency Medicine

## 2017-12-19 DIAGNOSIS — Z8546 Personal history of malignant neoplasm of prostate: Secondary | ICD-10-CM

## 2017-12-19 DIAGNOSIS — J22 Unspecified acute lower respiratory infection: Secondary | ICD-10-CM | POA: Diagnosis not present

## 2017-12-19 DIAGNOSIS — Z21 Asymptomatic human immunodeficiency virus [HIV] infection status: Secondary | ICD-10-CM | POA: Diagnosis not present

## 2017-12-19 DIAGNOSIS — B9789 Other viral agents as the cause of diseases classified elsewhere: Secondary | ICD-10-CM

## 2017-12-19 DIAGNOSIS — J0140 Acute pansinusitis, unspecified: Secondary | ICD-10-CM | POA: Diagnosis not present

## 2017-12-19 DIAGNOSIS — R05 Cough: Secondary | ICD-10-CM

## 2017-12-19 MED ORDER — HYDROCOD POLST-CPM POLST ER 10-8 MG/5ML PO SUER: 5.0000 mL | Freq: Two times a day (BID) | ORAL | 0 refills | Status: DC | PRN

## 2017-12-19 MED ORDER — ALBUTEROL SULFATE HFA 108 (90 BASE) MCG/ACT IN AERS: 1.0000 | INHALATION_SPRAY | Freq: Four times a day (QID) | RESPIRATORY_TRACT | 0 refills | Status: DC | PRN

## 2017-12-19 MED ORDER — DOXYCYCLINE HYCLATE 100 MG PO CAPS: 100.0000 mg | ORAL_CAPSULE | Freq: Two times a day (BID) | ORAL | 0 refills | Status: AC

## 2017-12-19 MED ORDER — FLUTICASONE PROPIONATE 50 MCG/ACT NA SUSP: 2.0000 | Freq: Every day | NASAL | 0 refills | Status: DC

## 2017-12-19 MED ORDER — BENZONATATE 200 MG PO CAPS: 200.0000 mg | ORAL_CAPSULE | Freq: Three times a day (TID) | ORAL | 0 refills | Status: DC | PRN

## 2017-12-19 MED ORDER — AEROCHAMBER PLUS MISC: 2 refills | Status: DC

## 2017-12-19 NOTE — Discharge Instructions (Addendum)
Do not take the hydrocodone/acetaminophen or the alprazolam if you are taking the cough syrup.  The cough syrup also has hydrocodone in it.  Too much hydrocodone and the combination of hydrocodone and the alprazolam can make you stop breathing.    Try Flonase, saline nasal irrigation with a Milta Deiters med rinse and distilled water as often as you want, an antihistamine such as Claritin, Zyrtec, or Allegra.  If the Claritin, Zyrtec or Allegra do not work for you, stop it and start Mucinex instead.  Doxycycline for which will cover both the sinusitis and a pneumonia.  Tessalon, Tussionex, 2 puffs from your albuterol inhaler with a spacer every 4-6 hours as needed for coughing, wheezing, shortness of breath.  Follow-up with your doctor at the [facility] as needed, to the ER if you get worse.

## 2017-12-19 NOTE — ED Provider Notes (Signed)
HPI  SUBJECTIVE:  Alexander Nicholson is a 62 y.o. male who presents with "a cold" for the past 8 days.  States it started on his chest and has moved up into his head.  Reports chest congestion and cough productive of greenish mucus, same material as his nasal congestion.  He also reports nasal congestion, rhinorrhea, postnasal drip, sinus pain and pressure, upper gum pain.  Reports sinus headache.  He reports some wheezing at night, chest soreness secondary to the cough, shortness of breath.  No change from his baseline dyspnea on exertion.  He does report some allergy symptoms with itchy, watery eyes and sneezing.  No facial swelling, fevers.  No antibiotics in the past month.  No antipyretic in the past 4 to 6 hours.  He tried Alka-Seltzer plus, Benadryl, Mucinex, hot tea, Tylenol.  The Benadryl seems to help with the congestion.  Symptoms are worse with bending forward.  Patient states that he is unable to sleep at night due to coughing. his grandson had similar symptoms prior to him getting sick.  He has a past medical history of HIV, viral load undetectable.  Not sure what his CD4 count is.  States that he is compliant with his medications.  Also history of stage IV prostate cancer on chemo and prednisone daily.  Also hypertension.  No history of allergies, asthma, eczema, COPD, smoking, diabetes.  PMD: Dr. Sherral Hammers at the [facility].    Past Medical History:  Diagnosis Date  . Cancer [facility]) 2013   prostate state 4  . HIV positive ([place])   . Hypertension   . PTSD (post-traumatic stress disorder)     Past Surgical History:  Procedure Laterality Date  . FEMUR FRACTURE SURGERY    . HERNIA REPAIR     Umbilical  . HIP SURGERY     left  . SPLENECTOMY    . WRIST SURGERY     right    Family History  Problem Relation Age of Onset  . Cancer Mother   . Prostate cancer Father     Social History   Tobacco Use  . Smoking status: Former Smoker    Packs/day: 0.50    Types: Cigarettes  . Smokeless  tobacco: Never Used  . Tobacco comment: Quit ~1 year ago  Substance Use Topics  . Alcohol use: Yes    Comment: occ  . Drug use: No    No current facility-administered medications for this encounter.   Current Outpatient Medications:  .  propranolol (INDERAL) 40 MG tablet, Take 1 tablet (40 mg total) 2 (two) times daily by mouth., Disp: 60 tablet, Rfl: 11 .  albuterol (PROVENTIL HFA;VENTOLIN HFA) 108 (90 Base) MCG/ACT inhaler, Inhale 1-2 puffs into the lungs every 6 (six) hours as needed for wheezing or shortness of breath., Disp: 1 Inhaler, Rfl: 0 .  benzonatate (TESSALON) 200 MG capsule, Take 1 capsule (200 mg total) by mouth 3 (three) times daily as needed for cough., Disp: 30 capsule, Rfl: 0 .  bisacodyl (DULCOLAX) 5 MG EC tablet, Take 1 tablet (5 mg total) by mouth 2 (two) times daily. Until stools normal, Disp: 14 tablet, Rfl: 0 .  chlorpheniramine-HYDROcodone (TUSSIONEX PENNKINETIC ER) 10-8 MG/5ML SUER, Take 5 mLs by mouth every 12 (twelve) hours as needed for cough., Disp: 60 mL, Rfl: 0 .  doxycycline (VIBRAMYCIN) 100 MG capsule, Take 1 capsule (100 mg total) by mouth 2 (two) times daily for 7 days., Disp: 14 capsule, Rfl: 0 .  fluticasone (FLONASE) 50 MCG/ACT  nasal spray, Place 2 sprays into both nostrils daily., Disp: 16 g, Rfl: 0 .  Spacer/Aero-Holding Chambers (AEROCHAMBER PLUS) inhaler, Use as instructed, Disp: 1 each, Rfl: 2  No Known Allergies   ROS  As noted in HPI.   Physical Exam  BP (!) 147/90 (BP Location: Left Arm)   Pulse 71   Temp 98.2 F (36.8 C) (Oral)   Resp 16   Ht 5\' 11"  (1.803 m)   Wt 93 kg   SpO2 99%   BMI 28.59 kg/m   Constitutional: Well developed, well nourished, no acute distress Eyes:  EOMI, conjunctiva normal bilaterally HENT: Normocephalic, atraumatic,mucus membranes moist. positive mild nasal congestion.  Erythematous, but not swollen turbinates.  Positive frontal,  maxillary sinus tenderness.  Positive postnasal drip. Respiratory:  Normal inspiratory effort.  Coarse cough.  Lungs clear bilaterally.  No wheezing, rales, rhonchi.  Positive anterior and lateral chest wall tenderness. Cardiovascular: Normal rate, regular rhythm, no murmurs, rubs, gallops GI: nondistended skin: No rash, skin intact Musculoskeletal: no deformities Neurologic: Alert & oriented x 3, no focal neuro deficits Psychiatric: Speech and behavior appropriate   ED Course   Medications - No data to display  Orders Placed This Encounter  Procedures  . DG Chest 2 View    Standing Status:   Standing    Number of Occurrences:   1    Order Specific Question:   Reason for Exam (SYMPTOM  OR DIAGNOSIS REQUIRED)    Answer:   cough h/o prostate CA, immunocomprimised, r/o PNA, mass    No results found for this or any previous visit (from the past 24 hour(s)). Dg Chest 2 View  Result Date: 12/19/2017 CLINICAL DATA:  Cough EXAM: CHEST - 2 VIEW COMPARISON:  11/09/2016 FINDINGS: Heart is borderline in size. Tortuosity of the thoracic aorta. No confluent opacities or effusions. No acute bony abnormality. IMPRESSION: Borderline heart size.  Tortuous thoracic aorta.  No active disease. Electronically Signed   By: Rolm Baptise M.D.   On: 12/19/2017 10:48    ED Clinical Impression  Lower respiratory tract infection  Acute non-recurrent pansinusitis   ED Assessment/Plan  Checking chest x-ray because patient is immunocompromised to rule out pneumonia.  Also has stage IV prostate cancer so we will also rule out mets.  [place] Narcotic database reviewed for this patient.  Given 1 Xanax and 15 Norco on 8/28.  And feel that the risk/benefit ratio today is favorable for proceeding with a prescription for controlled substance.  Will advise patient to not take benzos or the Norco if he still has them with the cough syrup.  Reviewed imaging independently.  No pneumonia per radiology.  See radiology report for full details.  Patient with sinusitis and lower respiratory  tract infection.  Home with Flonase, saline nasal irrigation with a Milta Deiters med rinse and distilled water as often as he wants, an antihistamine such as Claritin, Zyrtec, or Allegra.  Advised patient to discontinue this and start Mucinex if the antihistamine does not work.  Doxycycline for which will cover both the sinusitis and pneumonia.  Tessalon, Tussionex, 2 puffs from his albuterol inhaler with a spacer every 4-6 hours as needed for coughing, wheezing, shortness of breath.  Follow[facility]up with [facility] at the [facility] as needed, to the ER if he gets worse.  Discussed imaging, MDM, treatment plan, and plan for follow-up with patient. Discussed sn/sx that should prompt return to the ED. patient agrees with plan.   Meds ordered this encounter  Medications  .  doxycycline (VIBRAMYCIN) 100 MG capsule    Sig: Take 1 capsule (100 mg total) by mouth 2 (two) times daily for 7 days.    Dispense:  14 capsule    Refill:  0  . chlorpheniramine-HYDROcodone (TUSSIONEX PENNKINETIC ER) 10-8 MG/5ML SUER    Sig: Take 5 mLs by mouth every 12 (twelve) hours as needed for cough.    Dispense:  60 mL    Refill:  0  . albuterol (PROVENTIL HFA;VENTOLIN HFA) 108 (90 Base) MCG/ACT inhaler    Sig: Inhale 1-2 puffs into the lungs every 6 (six) hours as needed for wheezing or shortness of breath.    Dispense:  1 Inhaler    Refill:  0  . fluticasone (FLONASE) 50 MCG/ACT nasal spray    Sig: Place 2 sprays into both nostrils daily.    Dispense:  16 g    Refill:  0  . Spacer/Aero-Holding Chambers (AEROCHAMBER PLUS) inhaler    Sig: Use as instructed    Dispense:  1 each    Refill:  2  . benzonatate (TESSALON) 200 MG capsule    Sig: Take 1 capsule (200 mg total) by mouth 3 (three) times daily as needed for cough.    Dispense:  30 capsule    Refill:  0    *This clinic note was created using [employer]. Therefore, there may be occasional mistakes despite careful proofreading.   ?   Melynda Ripple, MD 12/19/17  1242

## 2017-12-19 NOTE — ED Triage Notes (Signed)
Patient c/o cough and chest congestion for a week.  

## 2018-08-25 ENCOUNTER — Encounter (HOSPITAL_COMMUNITY): Payer: Self-pay

## 2018-08-25 ENCOUNTER — Emergency Department (HOSPITAL_COMMUNITY)
Admission: EM | Admit: 2018-08-25 | Discharge: 2018-08-25 | Disposition: A | Discharge status: Home / Self Care | Payer: No Typology Code available for payment source | Attending: Emergency Medicine | Admitting: Emergency Medicine

## 2018-08-25 DIAGNOSIS — Z5321 Procedure and treatment not carried out due to patient leaving prior to being seen by health care provider: Secondary | ICD-10-CM | POA: Diagnosis not present

## 2018-08-25 DIAGNOSIS — R140 Abdominal distension (gaseous): Secondary | ICD-10-CM | POA: Diagnosis present

## 2018-08-25 NOTE — ED Triage Notes (Signed)
Pt was constipated for 5 days and two days ago got a laxative from his PCP and had a small BM this am, unable to pass gas and abdomen is distended and the pain moves from left to right

## 2018-08-25 NOTE — ED Notes (Signed)
Pt didn't answer when his name was called, pt was seen leaving waiting area right after triage

## 2018-08-28 DIAGNOSIS — N200 Calculus of kidney: Secondary | ICD-10-CM | POA: Insufficient documentation

## 2019-08-12 DIAGNOSIS — Z9081 Acquired absence of spleen: Secondary | ICD-10-CM | POA: Insufficient documentation

## 2020-01-18 DIAGNOSIS — M47812 Spondylosis without myelopathy or radiculopathy, cervical region: Secondary | ICD-10-CM | POA: Insufficient documentation

## 2020-03-16 DIAGNOSIS — K432 Incisional hernia without obstruction or gangrene: Secondary | ICD-10-CM | POA: Insufficient documentation

## 2020-09-27 DIAGNOSIS — M47816 Spondylosis without myelopathy or radiculopathy, lumbar region: Secondary | ICD-10-CM | POA: Insufficient documentation

## 2021-02-07 DIAGNOSIS — R310 Gross hematuria: Secondary | ICD-10-CM | POA: Insufficient documentation

## 2021-02-07 DIAGNOSIS — Z8739 Personal history of other diseases of the musculoskeletal system and connective tissue: Secondary | ICD-10-CM | POA: Insufficient documentation

## 2021-02-07 DIAGNOSIS — G629 Polyneuropathy, unspecified: Secondary | ICD-10-CM | POA: Insufficient documentation

## 2021-02-07 DIAGNOSIS — R52 Pain, unspecified: Secondary | ICD-10-CM | POA: Insufficient documentation

## 2021-02-07 DIAGNOSIS — K649 Unspecified hemorrhoids: Secondary | ICD-10-CM | POA: Insufficient documentation

## 2021-02-07 DIAGNOSIS — L309 Dermatitis, unspecified: Secondary | ICD-10-CM | POA: Insufficient documentation

## 2021-02-07 DIAGNOSIS — B20 Human immunodeficiency virus [HIV] disease: Secondary | ICD-10-CM | POA: Insufficient documentation

## 2021-02-07 DIAGNOSIS — K279 Peptic ulcer, site unspecified, unspecified as acute or chronic, without hemorrhage or perforation: Secondary | ICD-10-CM | POA: Insufficient documentation

## 2021-02-07 DIAGNOSIS — H5712 Ocular pain, left eye: Secondary | ICD-10-CM | POA: Insufficient documentation

## 2021-02-07 DIAGNOSIS — D7389 Other diseases of spleen: Secondary | ICD-10-CM | POA: Insufficient documentation

## 2021-02-07 DIAGNOSIS — E876 Hypokalemia: Secondary | ICD-10-CM | POA: Insufficient documentation

## 2021-02-07 DIAGNOSIS — R1030 Lower abdominal pain, unspecified: Secondary | ICD-10-CM | POA: Insufficient documentation

## 2021-02-07 DIAGNOSIS — K432 Incisional hernia without obstruction or gangrene: Secondary | ICD-10-CM | POA: Insufficient documentation

## 2021-02-07 DIAGNOSIS — F331 Major depressive disorder, recurrent, moderate: Secondary | ICD-10-CM | POA: Insufficient documentation

## 2021-02-07 DIAGNOSIS — M545 Low back pain, unspecified: Secondary | ICD-10-CM | POA: Insufficient documentation

## 2021-02-07 DIAGNOSIS — F1020 Alcohol dependence, uncomplicated: Secondary | ICD-10-CM | POA: Insufficient documentation

## 2021-02-07 DIAGNOSIS — Z86010 Personal history of colonic polyps: Secondary | ICD-10-CM | POA: Insufficient documentation

## 2021-02-07 DIAGNOSIS — F4312 Post-traumatic stress disorder, chronic: Secondary | ICD-10-CM | POA: Insufficient documentation

## 2021-02-07 DIAGNOSIS — K635 Polyp of colon: Secondary | ICD-10-CM | POA: Insufficient documentation

## 2021-02-07 DIAGNOSIS — K6282 Dysplasia of anus: Secondary | ICD-10-CM | POA: Insufficient documentation

## 2021-02-07 DIAGNOSIS — F32A Depression, unspecified: Secondary | ICD-10-CM | POA: Insufficient documentation

## 2021-02-07 DIAGNOSIS — M549 Dorsalgia, unspecified: Secondary | ICD-10-CM | POA: Insufficient documentation

## 2021-02-07 DIAGNOSIS — I10 Essential (primary) hypertension: Secondary | ICD-10-CM | POA: Insufficient documentation

## 2021-02-07 DIAGNOSIS — Q8901 Asplenia (congenital): Secondary | ICD-10-CM | POA: Insufficient documentation

## 2021-02-07 DIAGNOSIS — R079 Chest pain, unspecified: Secondary | ICD-10-CM | POA: Insufficient documentation

## 2021-02-07 DIAGNOSIS — G894 Chronic pain syndrome: Secondary | ICD-10-CM | POA: Insufficient documentation

## 2021-02-07 DIAGNOSIS — H1132 Conjunctival hemorrhage, left eye: Secondary | ICD-10-CM | POA: Insufficient documentation

## 2021-02-07 DIAGNOSIS — M25531 Pain in right wrist: Secondary | ICD-10-CM | POA: Insufficient documentation

## 2021-02-07 DIAGNOSIS — F1920 Other psychoactive substance dependence, uncomplicated: Secondary | ICD-10-CM | POA: Insufficient documentation

## 2021-02-07 DIAGNOSIS — F17200 Nicotine dependence, unspecified, uncomplicated: Secondary | ICD-10-CM | POA: Insufficient documentation

## 2021-02-07 DIAGNOSIS — C772 Secondary and unspecified malignant neoplasm of intra-abdominal lymph nodes: Secondary | ICD-10-CM | POA: Insufficient documentation

## 2021-02-07 DIAGNOSIS — C61 Malignant neoplasm of prostate: Secondary | ICD-10-CM | POA: Insufficient documentation

## 2021-02-07 DIAGNOSIS — G4733 Obstructive sleep apnea (adult) (pediatric): Secondary | ICD-10-CM | POA: Insufficient documentation

## 2021-03-19 ENCOUNTER — Encounter (HOSPITAL_COMMUNITY): Payer: Self-pay

## 2021-03-19 ENCOUNTER — Emergency Department (HOSPITAL_COMMUNITY): Payer: No Typology Code available for payment source

## 2021-03-19 ENCOUNTER — Emergency Department (HOSPITAL_COMMUNITY)
Admission: EM | Admit: 2021-03-19 | Discharge: 2021-03-19 | Disposition: A | Discharge status: Home / Self Care | Payer: No Typology Code available for payment source | Attending: Emergency Medicine | Admitting: Emergency Medicine

## 2021-03-19 ENCOUNTER — Other Ambulatory Visit: Payer: Self-pay

## 2021-03-19 DIAGNOSIS — I10 Essential (primary) hypertension: Secondary | ICD-10-CM | POA: Insufficient documentation

## 2021-03-19 DIAGNOSIS — Z79899 Other long term (current) drug therapy: Secondary | ICD-10-CM | POA: Insufficient documentation

## 2021-03-19 DIAGNOSIS — R6884 Jaw pain: Secondary | ICD-10-CM | POA: Insufficient documentation

## 2021-03-19 DIAGNOSIS — R519 Headache, unspecified: Secondary | ICD-10-CM | POA: Insufficient documentation

## 2021-03-19 DIAGNOSIS — Z8546 Personal history of malignant neoplasm of prostate: Secondary | ICD-10-CM | POA: Diagnosis not present

## 2021-03-19 DIAGNOSIS — Z21 Asymptomatic human immunodeficiency virus [HIV] infection status: Secondary | ICD-10-CM | POA: Insufficient documentation

## 2021-03-19 DIAGNOSIS — D649 Anemia, unspecified: Secondary | ICD-10-CM | POA: Insufficient documentation

## 2021-03-19 DIAGNOSIS — Z20822 Contact with and (suspected) exposure to covid-19: Secondary | ICD-10-CM | POA: Insufficient documentation

## 2021-03-19 LAB — URINALYSIS, ROUTINE W REFLEX MICROSCOPIC
Bilirubin Urine: NEGATIVE
Glucose, UA: NEGATIVE mg/dL
Hgb urine dipstick: NEGATIVE
Ketones, ur: NEGATIVE mg/dL
Leukocytes,Ua: NEGATIVE
Nitrite: NEGATIVE
Protein, ur: NEGATIVE mg/dL
Specific Gravity, Urine: 1.024 (ref 1.005–1.030)
pH: 5 (ref 5.0–8.0)

## 2021-03-19 LAB — CBC WITH DIFFERENTIAL/PLATELET
Abs Immature Granulocytes: 0.01 10*3/uL (ref 0.00–0.07)
Basophils Absolute: 0 10*3/uL (ref 0.0–0.1)
Basophils Relative: 1 %
Eosinophils Absolute: 0.1 10*3/uL (ref 0.0–0.5)
Eosinophils Relative: 3 %
HCT: 38.8 % — ABNORMAL LOW (ref 39.0–52.0)
Hemoglobin: 12.5 g/dL — ABNORMAL LOW (ref 13.0–17.0)
Immature Granulocytes: 0 %
Lymphocytes Relative: 44 %
Lymphs Abs: 2 10*3/uL (ref 0.7–4.0)
MCH: 28.4 pg (ref 26.0–34.0)
MCHC: 32.2 g/dL (ref 30.0–36.0)
MCV: 88.2 fL (ref 80.0–100.0)
Monocytes Absolute: 0.4 10*3/uL (ref 0.1–1.0)
Monocytes Relative: 9 %
Neutro Abs: 2 10*3/uL (ref 1.7–7.7)
Neutrophils Relative %: 43 %
Platelets: 250 10*3/uL (ref 150–400)
RBC: 4.4 MIL/uL (ref 4.22–5.81)
RDW: 14.5 % (ref 11.5–15.5)
WBC: 4.5 10*3/uL (ref 4.0–10.5)
nRBC: 0 % (ref 0.0–0.2)

## 2021-03-19 LAB — COMPREHENSIVE METABOLIC PANEL
ALT: 16 U/L (ref 0–44)
AST: 15 U/L (ref 15–41)
Albumin: 4.2 g/dL (ref 3.5–5.0)
Alkaline Phosphatase: 64 U/L (ref 38–126)
Anion gap: 6 (ref 5–15)
BUN: 24 mg/dL — ABNORMAL HIGH (ref 8–23)
CO2: 23 mmol/L (ref 22–32)
Calcium: 10.1 mg/dL (ref 8.9–10.3)
Chloride: 113 mmol/L — ABNORMAL HIGH (ref 98–111)
Creatinine, Ser: 0.95 mg/dL (ref 0.61–1.24)
GFR, Estimated: >60 mL/min (ref >60)
Glucose, Bld: 103 mg/dL — ABNORMAL HIGH (ref 70–99)
Potassium: 4 mmol/L (ref 3.5–5.1)
Sodium: 142 mmol/L (ref 135–145)
Total Bilirubin: 0.9 mg/dL (ref 0.3–1.2)
Total Protein: 7.3 g/dL (ref 6.5–8.1)

## 2021-03-19 LAB — TROPONIN I (HIGH SENSITIVITY): Troponin I (High Sensitivity): 6 ng/L (ref <18)

## 2021-03-19 LAB — LACTIC ACID, PLASMA: Lactic Acid, Venous: 1 mmol/L (ref 0.5–1.9)

## 2021-03-19 LAB — RESP PANEL BY RT-PCR (FLU A&B, COVID) ARPGX2
Influenza A by PCR: NEGATIVE
Influenza B by PCR: NEGATIVE
SARS Coronavirus 2 by RT PCR: NEGATIVE

## 2021-03-19 LAB — SEDIMENTATION RATE: Sed Rate: 16 mm/hr (ref 0–16)

## 2021-03-19 LAB — C-REACTIVE PROTEIN: CRP: 1 mg/dL — ABNORMAL HIGH (ref <1.0)

## 2021-03-19 MED ORDER — PREDNISONE 10 MG (21) PO TBPK: ORAL_TABLET | Freq: Every day | ORAL | 0 refills | Status: DC

## 2021-03-19 MED ORDER — MORPHINE SULFATE (PF) 4 MG/ML IV SOLN
4.0000 mg | Freq: Once | INTRAVENOUS | Status: AC
Start: 2021-03-19 — End: 2021-03-19
  Administered 2021-03-19: 4 mg via INTRAVENOUS
  Filled 2021-03-19: qty 1

## 2021-03-19 MED ORDER — MORPHINE SULFATE (PF) 4 MG/ML IV SOLN
4.0000 mg | Freq: Once | INTRAVENOUS | Status: AC
  Administered 2021-03-19: 4 mg via INTRAVENOUS
  Filled 2021-03-19: qty 1

## 2021-03-19 MED ORDER — IOHEXOL 350 MG/ML SOLN
80.0000 mL | Freq: Once | INTRAVENOUS | Status: AC | PRN
  Administered 2021-03-19: 80 mL via INTRAVENOUS

## 2021-03-19 MED ORDER — ONDANSETRON HCL 4 MG/2ML IJ SOLN
4.0000 mg | Freq: Once | INTRAMUSCULAR | Status: AC
  Administered 2021-03-19: 4 mg via INTRAVENOUS
  Filled 2021-03-19: qty 2

## 2021-03-19 MED ORDER — LACTATED RINGERS IV BOLUS
1000.0000 mL | Freq: Once | INTRAVENOUS | Status: AC
  Administered 2021-03-19: 1000 mL via INTRAVENOUS

## 2021-03-19 MED ORDER — SODIUM CHLORIDE (PF) 0.9 % IJ SOLN
INTRAMUSCULAR | Status: AC
  Filled 2021-03-19: qty 50

## 2021-03-19 MED ORDER — PREDNISONE 20 MG PO TABS
60.0000 mg | ORAL_TABLET | Freq: Once | ORAL | Status: AC
  Administered 2021-03-19: 60 mg via ORAL
  Filled 2021-03-19: qty 3

## 2021-03-19 MED ORDER — HYDROMORPHONE HCL 1 MG/ML IJ SOLN
1.0000 mg | Freq: Once | INTRAMUSCULAR | Status: AC
  Administered 2021-03-19: 1 mg via INTRAVENOUS
  Filled 2021-03-19: qty 1

## 2021-03-19 NOTE — ED Triage Notes (Signed)
Patient reports that he began having a left sided headache that radiated into his left ear and then to the left jaw area. Patient also reports that he has a "knot" under the left chin area" that is sore to touch x 1 week.

## 2021-03-19 NOTE — ED Provider Notes (Signed)
°  Face-to-face evaluation   History: He is here for evaluation of headache, left-sided that started about a week ago.  Had pain localized to the left TMJ.  He had cervical spine nerve ablations, bilaterally upper neck, last week.  He states that his neck pain is worse after those procedures.  He denies fever, chills, nausea vomiting  Physical exam: Alert, calm, cooperative.  Mild tenderness of the left TMJ but good motion there.  Left sternocleidomastoid muscle is tight, and tender.  There is no specific palpable mass in the neck.  MDM: Evaluation for  Chief Complaint  Patient presents with   Headache     Neck pain post nerve root ablation.  No obvious complication from the nerve block seen on imaging.  Neurovascularly intact at this time.  Suspect arthritis pain, contributing to his discomfort.  He does not require hospitalization or other acute interventions.  Medical screening examination/treatment/procedure(s) were conducted as a shared visit with non-physician practitioner(s) and myself.  I personally evaluated the patient during the encounter    Daleen Bo, MD 03/20/21 1036

## 2021-03-19 NOTE — Discharge Instructions (Addendum)
Your were seen in the ER today for facial pain.  While the exact cause of your pain remains unclear there does not appear to be an emergent problem at this time.  You have been prescribed a medication called prednisone which you should take for the next week to help with potential nerve inflammation after your recent procedure.  This will cost less than $5 if you take it to a local [facility] with the provided coupon. Please follow-up with your neurologist in the outpatient setting and return to the ER with any new numbness, tingling, weakness in your hands, blurry or double vision, or any other new severe symptoms.

## 2021-03-19 NOTE — ED Provider Notes (Signed)
[facility] DEPT Provider Note   CSN: [id number] Arrival date & time: 03/19/21  1159     History  Chief Complaint  Patient presents with   Headache    Alexander Nicholson is a 66 y.o. male who presents with concern for 1 week of left-sided frontal headache that radiates under to the left ear, over the left cheek, and down to the left jaw.  Pain is described as achy and very intense, not well managed with his oral opiates at home.  Also endorses some swelling "a knot "in her left side of his chin that is exquisitely tender to the touch.  Of note patient recently underwent radiofrequency ablation of the C-spine for chronic neck pain.  At baseline has tremor of the left hand.  Upon further discussion with the patient it appears that his primary source of pain is immediately anterior to the left tragus with palpation which is exacerbated with lateral rotation of the head to the right extending down into the proximal left mandible.  Most of his care is at the [facility] in [place].  Of note the patient did recently undergo radioablation in the C-spine bilaterally for chronic pain.  Is not had any complications since that surgery does have radioablation of the L-spine planned.  HPI     Home Medications Prior to Admission medications   Medication Sig Start Date End Date Taking? Authorizing Provider  abiraterone acetate (ZYTIGA) 250 MG tablet Take 250 mg by mouth in the morning, at noon, in the evening, and at bedtime. 05/30/20  Yes [provider]  albuterol (PROVENTIL HFA;VENTOLIN HFA) 108 (90 Base) MCG/ACT inhaler Inhale 1-2 puffs into the lungs every 6 (six) hours as needed for wheezing or shortness of breath. 12/19/17  Yes Melynda Ripple, MD  atorvastatin (LIPITOR) 20 MG tablet Take 20 mg by mouth daily. 07/04/20  Yes [provider]  carboxymethylcellulose (REFRESH PLUS) 0.5 % SOLN Place 1 drop into both eyes in the morning, at noon, in the evening,  and at bedtime. 10/30/19  Yes [provider]  Docusate Sodium (DSS) 100 MG CAPS Take 1 capsule by mouth 3 (three) times daily. 12/27/20  Yes [provider]  dolutegravir (TIVICAY) 50 MG tablet Take 50 mg by mouth daily. 06/29/19  Yes [provider]  emtricitabine-rilpivir-tenofovir AF (ODEFSEY) 200-25-25 MG TABS tablet Take 1 tablet by mouth daily with breakfast. 06/29/19  Yes [provider]  fluticasone (FLONASE) 50 MCG/ACT nasal spray Place 1 spray into the nose daily. 12/19/17  Yes [provider]  imiquimod (ALDARA) 5 % cream Apply 1 application topically as directed. Apply small amount to affected area. Apply to perianal skin and in anal canal at night three times per week 02/14/21  Yes [provider]  potassium chloride SA (KLOR-CON M) 20 MEQ tablet Take 20 mEq by mouth daily with breakfast. 09/02/19  Yes [provider]  predniSONE (STERAPRED UNI-PAK 21 TAB) 10 MG (21) TBPK tablet Take by mouth daily. Take 6 tabs by mouth daily  for 1 days, then 5 tabs for 1 days, then 4 tabs for 1 days, then 3 tabs for 1 days, 2 tabs for 1 days, then 1 tab by mouth daily for 1 days 03/19/21  Yes Vandy Fong R, PA-C  traZODone (DESYREL) 100 MG tablet Take 100 mg by mouth at bedtime as needed for sleep. 11/02/20  Yes [provider]      Allergies    Patient has no known allergies.  Review of Systems   Review of Systems  Constitutional: Negative.   HENT: Negative.         Facial pain  Eyes: Negative.  Negative for photophobia and visual disturbance.  Respiratory: Negative.    Cardiovascular: Negative.   Gastrointestinal: Negative.   Musculoskeletal: Negative.   Skin: Negative.   Neurological:  Positive for tremors and headaches. Negative for weakness.  Hematological: Negative.   Psychiatric/Behavioral: Negative.     Physical Exam Updated Vital Signs BP (!) 148/90 (BP Location: Right Arm)    Pulse 70    Temp 98.6 F (37  C) (Oral)    Resp 15    Ht 5\' 11"  (1.803 m)    Wt 86.2 kg    SpO2 100%    BMI 26.50 kg/m  Physical Exam Vitals and nursing note reviewed.  Constitutional:      Appearance: He is not toxic-appearing.  HENT:     Head: Normocephalic and atraumatic. No raccoon eyes, Battle's sign or masses.      Comments: No facial swelling, asymmetry, warmth to the touch.  No tenderness palpation over the left temple or frontal area.    Right Ear: Tympanic membrane normal.     Left Ear: Tympanic membrane normal.     Nose: Nose normal.     Mouth/Throat:     Mouth: Mucous membranes are moist.     Pharynx: Oropharynx is clear. Uvula midline. No oropharyngeal exudate or posterior oropharyngeal erythema.  Eyes:     General: Lids are normal. Vision grossly intact.        Right eye: No discharge.        Left eye: No discharge.     Extraocular Movements: Extraocular movements intact.     Conjunctiva/sclera: Conjunctivae normal.     Pupils: Pupils are equal, round, and reactive to light.  Neck:     Trachea: Trachea normal.     Meningeal: Kernig's sign absent.      Comments: Full range of motion of the neck without mass or asymmetry.  No cellulitic changes Cardiovascular:     Rate and Rhythm: Normal rate and regular rhythm.     Pulses: Normal pulses.     Heart sounds: Normal heart sounds. No murmur heard. Pulmonary:     Effort: Pulmonary effort is normal. No tachypnea, bradypnea, accessory muscle usage, prolonged expiration or respiratory distress.     Breath sounds: Normal breath sounds. No wheezing or rales.  Chest:     Chest wall: No mass, lacerations, deformity, swelling, tenderness, crepitus or edema.  Abdominal:     General: Bowel sounds are normal. There is no distension.     Palpations: Abdomen is soft.     Tenderness: There is no abdominal tenderness. There is no right CVA tenderness, left CVA tenderness, guarding or rebound.  Musculoskeletal:        General: No deformity.     Cervical back:  Normal range of motion and neck supple. No edema, rigidity or crepitus. No pain with movement, spinous process tenderness or muscular tenderness. Normal range of motion.     Right lower leg: No edema.     Left lower leg: No edema.  Lymphadenopathy:     Cervical: Cervical adenopathy present.     Right cervical: Superficial cervical adenopathy present.     Left cervical: Superficial cervical adenopathy present.  Skin:    General: Skin is warm and dry.     Capillary Refill: Capillary refill takes less than 2 seconds.  Neurological:     Mental Status: He is alert. Mental status is at baseline.     GCS: GCS eye subscore is 4. GCS verbal subscore is 5. GCS motor subscore is 6.     Cranial Nerves: Cranial nerves 2-12 are intact.     Sensory: Sensation is intact.     Motor: Motor function is intact.     Coordination: Coordination is intact.     Gait: Gait is intact.     Comments: Left hand tremor at baseline  Psychiatric:        Mood and Affect: Mood normal.    ED Results / Procedures / Treatments   Labs (all labs ordered are listed, but only abnormal results are displayed) Labs Reviewed  CBC WITH DIFFERENTIAL/PLATELET - Abnormal; Notable for the following components:      Result Value   Hemoglobin 12.5 (*)    HCT 38.8 (*)    All other components within normal limits  COMPREHENSIVE METABOLIC PANEL - Abnormal; Notable for the following components:   Chloride 113 (*)    Glucose, Bld 103 (*)    BUN 24 (*)    All other components within normal limits  URINALYSIS, ROUTINE W REFLEX MICROSCOPIC - Abnormal; Notable for the following components:   Color, Urine STRAW (*)    All other components within normal limits  C-REACTIVE PROTEIN - Abnormal; Notable for the following components:   CRP 1.0 (*)    All other components within normal limits  RESP PANEL BY RT-PCR (FLU A&B, COVID) [id number]  LACTIC ACID, PLASMA  SEDIMENTATION RATE  LACTIC ACID, PLASMA  TROPONIN I (HIGH SENSITIVITY)   TROPONIN I (HIGH SENSITIVITY)    EKG EKG Interpretation  Date/Time:  Sunday March 19 2021 13:43:58 EST Ventricular Rate:  60 PR Interval:  213 QRS Duration: 89 QT Interval:  417 QTC Calculation: 417 R Axis:   13 Text Interpretation: Sinus rhythm Atrial premature complex Borderline prolonged PR interval since last tracing no significant change Confirmed by Daleen Bo [phone]) on 03/19/2021 3:35:42 PM  Radiology CT Head Wo Contrast  Result Date: 03/19/2021 CLINICAL DATA:  Delirium. EXAM: CT HEAD WITHOUT CONTRAST TECHNIQUE: Contiguous axial images were obtained from the base of the skull through the vertex without intravenous contrast. COMPARISON:  None. FINDINGS: Brain: No evidence of acute infarction, hemorrhage, hydrocephalus, extra-axial collection or mass lesion/mass effect. Vascular: No hyperdense vessel or unexpected calcification. Skull: Normal. Negative for fracture or focal lesion. Sinuses/Orbits: No acute finding. Other: None. IMPRESSION: No acute intracranial abnormality seen. Electronically Signed   By: Marijo Conception M.D.   On: 03/19/2021 13:44   CT Soft Tissue Neck W Contrast  Result Date: 03/19/2021 CLINICAL DATA:  Left-sided neck and jaw pain and headache. EXAM: CT NECK WITH CONTRAST TECHNIQUE: Multidetector CT imaging of the neck was performed using the standard protocol following the bolus administration of intravenous contrast. CONTRAST:  32mL OMNIPAQUE IOHEXOL 350 MG/ML SOLN COMPARISON:  None. FINDINGS: Pharynx and larynx: No evidence of mass or swelling. No fluid collection or significant inflammatory changes in the parapharyngeal or retropharyngeal spaces. Salivary glands: No inflammation, mass, or stone. Thyroid: Unremarkable. Lymph nodes: No enlarged or suspicious lymph nodes in the neck. Vascular: Major vascular structures of the neck are grossly patent. Limited intracranial: Unremarkable. Visualized orbits: Unremarkable. Mastoids and visualized paranasal sinuses:  Clear. Skeleton: Anterior vertebral osteophytes throughout the cervical spine, particularly bulky from C3-C6. Edentulous. No suspicious osseous lesion. Upper chest: Clear lung apices. Other: None. IMPRESSION: No acute abnormality  or mass identified in the neck. Electronically Signed   By: Logan Bores M.D.   On: 03/19/2021 15:58    Procedures Procedures    Medications Ordered in ED Medications  sodium chloride (PF) 0.9 % injection (has no administration in time range)  lactated ringers bolus 1,000 mL (0 mLs Intravenous Stopped 03/19/21 1922)  morphine 4 MG/ML injection 4 mg (4 mg Intravenous Given 03/19/21 1345)  ondansetron (ZOFRAN) injection 4 mg (4 mg Intravenous Given 03/19/21 1345)  HYDROmorphone (DILAUDID) injection 1 mg (1 mg Intravenous Given 03/19/21 1439)  iohexol (OMNIPAQUE) 350 MG/ML injection 80 mL (80 mLs Intravenous Contrast Given 03/19/21 1538)  predniSONE (DELTASONE) tablet 60 mg (60 mg Oral Given 03/19/21 1707)  HYDROmorphone (DILAUDID) injection 1 mg (1 mg Intravenous Given 03/19/21 1707)  morphine 4 MG/ML injection 4 mg (4 mg Intravenous Given 03/19/21 1920)    ED Course/ Medical Decision Making/ A&P                           Medical Decision Making This patient presents to the ED for concern of left-sided facial pain, this involves an extensive number of treatment options, and is a complaint that carries with it a high risk of complications and morbidity.  The differential diagnosis includes but is not limited to facial cellulitis, Trigeminal neuralgia, giant cell arteritis, migraine, cluster type headache, TMJ, oral pharyngeal abscess, acute sinusitis, central lesion causing radicular pain; when considering jaw pain must consider ACS.  Comorbidities that complicate the patient evaluation: HIV Stage IV prostate cancer Hypertension PTSD   Additional history obtained:  Additional history obtained from chart review  External records from outside source obtained and reviewed  including Care Everywhere and records from the [facility]   Lab Tests:  I Ordered, reviewed, and interpreted labs.  The pertinent results include:   CBC which was without leukocytosis and with mild anemia with hemoglobin of 12.5.  CMP which was unremarkable.  Inflammatory markers which were normal as well as lactic acid.  Troponin which was normal as well   Imaging Studies ordered:  I ordered imaging studies including CT soft tissues of the neck was obtained given recent procedure as well as CT of the head to rule out central lesion. I independently visualized and interpreted imaging which showed no acute abnormality in the soft tissues of the neck or within the head. I agree with the radiologist interpretation   Cardiac Monitoring:  The patient was maintained on a cardiac monitor.  I personally viewed and interpreted the cardiac monitored which showed an underlying rhythm of: Sinus rhythm   Medicines ordered and prescription drug management:  I ordered medication including analgesia and antiemetic for nausea and vomiting.  Subsequently steroid was ordered for likely radicular pain pain Reevaluation of the patient after these medicines showed that the patient improved I have reviewed the patients home medicines and have made adjustments as needed  Tests Considered: Consider MRI of the brain and C-spine for further evaluation given recent cervical procedure, however after discussion with attending physician these were not felt to be warranted given patient's improving condition and overall reassuring work-up in the emergency department.  Critical Interventions:  Pain medication as patient was rating pain 10/10 upon arrival. Steroid to treat likely radicular pain versus arthritis  Consultations Obtained:  None  Problem List / ED Course: - Jaw pain: Extensive work-up was performed.  Doubt ACS given inconsistent HPI with normal EKG and troponin.  Doubt giant  cell arteritis given normal  inflammatory markers and inconsistent location with temporal presentation of this malady.  Additionally doubt trigeminal neuralgia as pain is constant and not elicited with light touch but rather with firmer palpation.  Question TMJ syndrome versus radicular pain from the neck.  While the exact etiology of this patient's symptoms remains unclear doubt emergent cause at this time giving overall very reassuring work-up.  No underlying central lesion noted on CT of the head nor was there any mass or lesion noted on CT of the soft tissues of the neck. -Hypertension: Observed; patient will continue to take oral medications at home.  Reevaluation:  After the interventions noted above, I reevaluated the patient and found that they have :improved  Social Determinants of Health Lives alone, low income, retired following with the [facility].  States he will not be able to purchase his prescription of prednisone at normal price.  Good Rx coupon provided.  First dose of prednisone administered in the emergency department.  [facility].  Dispostion:  After consideration of the diagnostic results and the patients response to treatment feel that the patent would benefit from discharge home.  Given overall very reassuring work-up without evidence of emergent etiology of her symptoms, do not feel there is any further work-up warranted in the ER at this time.  Will discharge with prescription for prednisone.   Hadley voiced understanding of his medical evaluation and treatment plan.  Each of his questions answered to his expressed satisfaction.  Return precautions were given.  Patient is well-appearing, stable, and appropriate for discharge at this time.   This chart was dictated using voice recognition software, Dragon. Despite the best efforts of this provider to proofread and correct errors, errors may still occur which can change documentation  meaning.  Final Clinical Impression(s) / ED Diagnoses Final diagnoses:  Jaw pain    Rx / DC Orders ED Discharge Orders          Ordered    predniSONE (STERAPRED UNI-PAK 21 TAB) 10 MG (21) TBPK tablet  Daily        03/19/21 1840              Iridian Reader, Gypsy Balsam, PA-C 03/19/21 2230    Daleen Bo, MD 03/20/21 1036

## 2021-04-09 ENCOUNTER — Emergency Department (HOSPITAL_COMMUNITY): Payer: No Typology Code available for payment source

## 2021-04-09 ENCOUNTER — Encounter (HOSPITAL_COMMUNITY): Payer: Self-pay

## 2021-04-09 ENCOUNTER — Emergency Department (HOSPITAL_COMMUNITY)
Admission: EM | Admit: 2021-04-09 | Discharge: 2021-04-09 | Disposition: A | Discharge status: Home / Self Care | Payer: No Typology Code available for payment source | Attending: Emergency Medicine | Admitting: Emergency Medicine

## 2021-04-09 DIAGNOSIS — R051 Acute cough: Secondary | ICD-10-CM | POA: Diagnosis not present

## 2021-04-09 DIAGNOSIS — I2510 Atherosclerotic heart disease of native coronary artery without angina pectoris: Secondary | ICD-10-CM | POA: Insufficient documentation

## 2021-04-09 DIAGNOSIS — R1084 Generalized abdominal pain: Secondary | ICD-10-CM | POA: Insufficient documentation

## 2021-04-09 DIAGNOSIS — G8929 Other chronic pain: Secondary | ICD-10-CM | POA: Insufficient documentation

## 2021-04-09 DIAGNOSIS — R062 Wheezing: Secondary | ICD-10-CM | POA: Diagnosis not present

## 2021-04-09 DIAGNOSIS — D72819 Decreased white blood cell count, unspecified: Secondary | ICD-10-CM | POA: Insufficient documentation

## 2021-04-09 DIAGNOSIS — M545 Low back pain, unspecified: Secondary | ICD-10-CM | POA: Insufficient documentation

## 2021-04-09 DIAGNOSIS — R0789 Other chest pain: Secondary | ICD-10-CM | POA: Insufficient documentation

## 2021-04-09 DIAGNOSIS — Z20822 Contact with and (suspected) exposure to covid-19: Secondary | ICD-10-CM | POA: Insufficient documentation

## 2021-04-09 DIAGNOSIS — R079 Chest pain, unspecified: Secondary | ICD-10-CM

## 2021-04-09 LAB — I-STAT CHEM 8, ED
BUN: 11 mg/dL (ref 8–23)
Calcium, Ion: 1.2 mmol/L (ref 1.15–1.40)
Chloride: 106 mmol/L (ref 98–111)
Creatinine, Ser: 0.8 mg/dL (ref 0.61–1.24)
Glucose, Bld: 98 mg/dL (ref 70–99)
HCT: 34 % — ABNORMAL LOW (ref 39.0–52.0)
Hemoglobin: 11.6 g/dL — ABNORMAL LOW (ref 13.0–17.0)
Potassium: 3.6 mmol/L (ref 3.5–5.1)
Sodium: 142 mmol/L (ref 135–145)
TCO2: 27 mmol/L (ref 22–32)

## 2021-04-09 LAB — BASIC METABOLIC PANEL
Anion gap: 7 (ref 5–15)
BUN: 12 mg/dL (ref 8–23)
CO2: 25 mmol/L (ref 22–32)
Calcium: 9.1 mg/dL (ref 8.9–10.3)
Chloride: 107 mmol/L (ref 98–111)
Creatinine, Ser: 0.86 mg/dL (ref 0.61–1.24)
GFR, Estimated: >60 mL/min (ref >60)
Glucose, Bld: 105 mg/dL — ABNORMAL HIGH (ref 70–99)
Potassium: 3.5 mmol/L (ref 3.5–5.1)
Sodium: 139 mmol/L (ref 135–145)

## 2021-04-09 LAB — CBC
HCT: 37.3 % — ABNORMAL LOW (ref 39.0–52.0)
Hemoglobin: 12.3 g/dL — ABNORMAL LOW (ref 13.0–17.0)
MCH: 28.6 pg (ref 26.0–34.0)
MCHC: 33 g/dL (ref 30.0–36.0)
MCV: 86.7 fL (ref 80.0–100.0)
Platelets: 226 10*3/uL (ref 150–400)
RBC: 4.3 MIL/uL (ref 4.22–5.81)
RDW: 14.9 % (ref 11.5–15.5)
WBC: 3.7 10*3/uL — ABNORMAL LOW (ref 4.0–10.5)
nRBC: 0 % (ref 0.0–0.2)

## 2021-04-09 LAB — RESP PANEL BY RT-PCR (FLU A&B, COVID) ARPGX2
Influenza A by PCR: NEGATIVE
Influenza B by PCR: NEGATIVE
SARS Coronavirus 2 by RT PCR: NEGATIVE

## 2021-04-09 LAB — TROPONIN I (HIGH SENSITIVITY)
Troponin I (High Sensitivity): 7 ng/L (ref <18)
Troponin I (High Sensitivity): 6 ng/L (ref <18)

## 2021-04-09 MED ORDER — BENZONATATE 100 MG PO CAPS: 100.0000 mg | ORAL_CAPSULE | Freq: Three times a day (TID) | ORAL | 0 refills | Status: AC

## 2021-04-09 MED ORDER — IOHEXOL 350 MG/ML SOLN
100.0000 mL | Freq: Once | INTRAVENOUS | Status: AC | PRN
  Administered 2021-04-09: 100 mL via INTRAVENOUS

## 2021-04-09 MED ORDER — ALBUTEROL SULFATE HFA 108 (90 BASE) MCG/ACT IN AERS: 1.0000 | INHALATION_SPRAY | Freq: Four times a day (QID) | RESPIRATORY_TRACT | 0 refills | Status: AC | PRN

## 2021-04-09 MED ORDER — ALBUTEROL SULFATE HFA 108 (90 BASE) MCG/ACT IN AERS
2.0000 | INHALATION_SPRAY | Freq: Once | RESPIRATORY_TRACT | Status: AC
  Administered 2021-04-09: 2 via RESPIRATORY_TRACT
  Filled 2021-04-09: qty 6.7

## 2021-04-09 MED ORDER — HYDROCODONE-ACETAMINOPHEN 5-325 MG PO TABS
2.0000 | ORAL_TABLET | Freq: Once | ORAL | Status: AC
  Administered 2021-04-09: 2 via ORAL
  Filled 2021-04-09: qty 2

## 2021-04-09 MED ORDER — PREDNISONE 20 MG PO TABS
60.0000 mg | ORAL_TABLET | Freq: Once | ORAL | Status: AC
Start: 2021-04-09 — End: 2021-04-09
  Administered 2021-04-09: 60 mg via ORAL
  Filled 2021-04-09: qty 3

## 2021-04-09 MED ORDER — PREDNISONE 50 MG PO TABS
50.0000 mg | ORAL_TABLET | Freq: Every day | ORAL | 0 refills | Status: AC
Start: 2021-04-09 — End: ?

## 2021-04-09 NOTE — Discharge Instructions (Addendum)
As discussed in your room you need repeat imaging of your chest in 1 year.  I have started you on a higher dose of steroids, use inhalers at home.  I have also written for some cough medicine.  Continue to take her home pain medicine as prescribed by your outpatient provider. Return for new or worsening symptoms

## 2021-04-09 NOTE — ED Provider Notes (Signed)
[facility] DEPT Provider Note   CSN: [id number] Arrival date & time: 04/09/21  0932    History  Chief Complaint  Patient presents with   Chest Pain    Alexander Nicholson is a 66 y.o. male here for evaluation multiple complaints.  Had been working on a car the last few days.  Noted some lower back pain as well as some pain into his anterior chest. Described as an aching sensation.  Will intermittently feel like a "stabbing" sensation.  Sometimes worse with movement, deep breathing.  Cough for the last week which is nonproductive. Feels like he has a "cold" in his chest.  He admits to some generalized abdominal pain, had history of recent hernia repair.  States he is feels like he is constipated however had a bowel movement this morning if any melena or blood per rectum.  Chest pain does not radiate into left arm, jaw.  Pain not exertional in nature.  No associated fever, shortness of breath, diaphoresis, nausea, vomiting, paresthesias or weakness.  Denies known hx of AAA, dissection, CAD. Seen by cards "along time ago" for possible arrhythmia however denies anticoagulation use hx of CAD. Occasional feels "wheezy" with his cough, Not using inhalers at home.  HPI     Home Medications Prior to Admission medications   Medication Sig Start Date End Date Taking? Authorizing Provider  albuterol (VENTOLIN HFA) 108 (90 Base) MCG/ACT inhaler Inhale 1-2 puffs into the lungs every 6 (six) hours as needed for wheezing or shortness of breath. 04/09/21  Yes Mare Ludtke A, PA-C  benzonatate (TESSALON) 100 MG capsule Take 1 capsule (100 mg total) by mouth every 8 (eight) hours. 04/09/21  Yes Mackenna Kamer A, PA-C  predniSONE (DELTASONE) 50 MG tablet Take 1 tablet (50 mg total) by mouth daily. 04/09/21  Yes Anikin Prosser A, PA-C  abiraterone acetate (ZYTIGA) 250 MG tablet Take 250 mg by mouth in the morning, at noon, in the evening, and at bedtime. 05/30/20   [provider]  atorvastatin (LIPITOR) 20 MG tablet Take 20 mg by mouth daily. 07/04/20   [provider]  carboxymethylcellulose (REFRESH PLUS) 0.5 % SOLN Place 1 drop into both eyes in the morning, at noon, in the evening, and at bedtime. 10/30/19   [provider]  Docusate Sodium (DSS) 100 MG CAPS Take 1 capsule by mouth 3 (three) times daily. 12/27/20   [provider]  dolutegravir (TIVICAY) 50 MG tablet Take 50 mg by mouth daily. 06/29/19   [provider]  emtricitabine-rilpivir-tenofovir AF (ODEFSEY) 200-25-25 MG TABS tablet Take 1 tablet by mouth daily with breakfast. 06/29/19   [provider]  fluticasone (FLONASE) 50 MCG/ACT nasal spray Place 1 spray into the nose daily. 12/19/17   [provider]  imiquimod (ALDARA) 5 % cream Apply 1 application topically as directed. Apply small amount to affected area. Apply to perianal skin and in anal canal at night three times per week 02/14/21   [provider]  potassium chloride SA (KLOR-CON M) 20 MEQ tablet Take 20 mEq by mouth daily with breakfast. 09/02/19   [provider]  traZODone (DESYREL) 100 MG tablet Take 100 mg by mouth at bedtime as needed for sleep. 11/02/20   [provider]      Allergies    Patient has no known allergies.    Review of Systems   Review of Systems  Constitutional: Negative.   HENT: Negative.    Respiratory:  Positive  for cough. Negative for shortness of breath.   Cardiovascular:  Positive for chest pain. Negative for palpitations and leg swelling.  Gastrointestinal:  Positive for abdominal pain (since hernia repair) and constipation (Last BM today). Negative for anal bleeding, blood in stool, diarrhea, nausea and vomiting.  Genitourinary: Negative.   Musculoskeletal:  Positive for back pain. Negative for neck pain and neck stiffness.  Skin: Negative.   Neurological: Negative.   All other systems reviewed and are  negative.  Physical Exam Updated Vital Signs BP (!) 144/101 (BP Location: Right Arm)    Pulse 71    Temp 98.5 F (36.9 C) (Oral)    Resp 16    SpO2 95%  Physical Exam Vitals and nursing note reviewed.  Constitutional:      General: He is not in acute distress.    Appearance: He is well-developed. He is not ill-appearing, toxic-appearing or diaphoretic.  HENT:     Head: Normocephalic and atraumatic.  Eyes:     Pupils: Pupils are equal, round, and reactive to light.  Cardiovascular:     Rate and Rhythm: Normal rate and regular rhythm.     Pulses: Normal pulses.          Radial pulses are 2+ on the right side and 2+ on the left side.       Dorsalis pedis pulses are 2+ on the right side and 2+ on the left side.     Heart sounds: Normal heart sounds.  Pulmonary:     Effort: Pulmonary effort is normal. No respiratory distress.     Breath sounds: Wheezing present.     Comments: Mild expiratory wheeze. Speaks in full sentences without difficulty Chest:     Comments: Mild tenderness anterior/posterior chest wall. No crepitus step off Abdominal:     General: Bowel sounds are normal. There is no distension.     Palpations: Abdomen is soft.     Comments: Soft, gen tenderness. Large midline incision without hernia, erythema, warmth  Musculoskeletal:        General: Normal range of motion.     Cervical back: Normal range of motion and neck supple.     Right lower leg: No tenderness. No edema.     Left lower leg: No tenderness. No edema.     Comments: Full ROM, no bony tenderness, compartments soft, Homan neg, No LE edema. Diffuse tenderness lower back. Neg straight leg raise bl.  Skin:    General: Skin is warm and dry.     Capillary Refill: Capillary refill takes less than 2 seconds.     Comments: No edema, erythema, warmth  Neurological:     General: No focal deficit present.     Mental Status: He is alert and oriented to person, place, and time.    ED Results / Procedures /  Treatments   Labs (all labs ordered are listed, but only abnormal results are displayed) Labs Reviewed  BASIC METABOLIC PANEL - Abnormal; Notable for the following components:      Result Value   Glucose, Bld 105 (*)    All other components within normal limits  CBC - Abnormal; Notable for the following components:   WBC 3.7 (*)    Hemoglobin 12.3 (*)    HCT 37.3 (*)    All other components within normal limits  I-STAT CHEM 8, ED - Abnormal; Notable for the following components:   Hemoglobin 11.6 (*)    HCT 34.0 (*)    All other components  within normal limits  RESP PANEL BY RT-PCR (FLU A&B, COVID) [id number]  TROPONIN I (HIGH SENSITIVITY)  TROPONIN I (HIGH SENSITIVITY)    EKG EKG Interpretation  Date/Time:  Sunday April 09 2021 09:39:55 EST Ventricular Rate:  70 PR Interval:  191 QRS Duration: 78 QT Interval:  404 QTC Calculation: 436 R Axis:   41 Text Interpretation: Sinus rhythm Left ventricular hypertrophy Confirmed by Milton Ferguson [phone]) on 04/09/2021 2:47:32 PM  Radiology DG Chest 2 View  Result Date: 04/09/2021 CLINICAL DATA:  Chest pain. EXAM: CHEST - 2 VIEW COMPARISON:  Multiple chest XRs, most 12/19/2017. CT neck, 03/19/2018. FINDINGS: Cardiac silhouette is within normal limits. Tortuous thoracic aorta with similar widening of the mediastinum. Mild hyperinflation. No focal consolidation or mass. No pleural effusion or pneumothorax. No acute displaced fracture. IMPRESSION: 1. No active cardiopulmonary process. 2. Tortuous thoracic aorta. Electronically Signed   By: Michaelle Birks M.D.   On: 04/09/2021 10:10   CT Angio Chest/Abd/Pel for Dissection W and/or Wo Contrast  Result Date: 04/09/2021 CLINICAL DATA:  Patient with mid sternal chest pain. EXAM: CT ANGIOGRAPHY CHEST, ABDOMEN AND PELVIS TECHNIQUE: Non-contrast CT of the chest was initially obtained. Multidetector CT imaging through the chest, abdomen and pelvis was performed using the standard protocol during bolus  administration of intravenous contrast. Multiplanar reconstructed images and MIPs were obtained and reviewed to evaluate the vascular anatomy. RADIATION DOSE REDUCTION: This exam was performed according to the departmental dose-optimization program which includes automated exposure control, adjustment of the mA and/or kV according to patient size and/or use of iterative reconstruction technique. CONTRAST:  167mL OMNIPAQUE IOHEXOL 350 MG/ML SOLN COMPARISON:  Chest CT 01/04/2014. FINDINGS: CTA CHEST FINDINGS Cardiovascular: Noncontrast images through the chest demonstrate no peripheral high attenuation within the thoracic aorta to suggest acute intramural hematoma. Post-contrast images demonstrate normal heart size. Ascending thoracic aorta measures 4 cm. Thoracic aortic vascular calcifications. No thoracic aortic dissection. No central, main or lobar pulmonary embolus. Mediastinum/Nodes: No enlarged axillary, mediastinal or hilar lymphadenopathy. Lungs/Pleura: Central airways are patent. Subpleural ground-glass reticular opacities within the right lower lobe may represent atelectasis or scarring. Musculoskeletal: No aggressive or acute appearing osseous lesions. Review of the MIP images confirms the above findings. CTA ABDOMEN AND PELVIS FINDINGS VASCULAR Aorta: Normal caliber aorta without aneurysm, dissection, vasculitis or significant stenosis. Celiac: Patent without evidence of aneurysm, dissection, vasculitis or significant stenosis. SMA: Patent without evidence of aneurysm, dissection, vasculitis or significant stenosis. Renals: Both renal arteries are patent without evidence of aneurysm, dissection, vasculitis, fibromuscular dysplasia or significant stenosis. IMA: Patent without evidence of aneurysm, dissection, vasculitis or significant stenosis. Inflow: Patent without evidence of aneurysm, dissection, vasculitis or significant stenosis. Veins: No obvious venous abnormality within the limitations of this  arterial phase study. Review of the MIP images confirms the above findings. NON-VASCULAR Hepatobiliary: The liver is normal in size and contour. Gallbladder is unremarkable. Pancreas: Unremarkable Spleen: Unremarkable Adrenals/Urinary Tract: Normal adrenal glands. Kidneys enhance symmetrically with contrast. No hydronephrosis. Urinary bladder is unremarkable. Stomach/Bowel: No abnormal bowel wall thickening or evidence for bowel obstruction. No free fluid or free intraperitoneal air. Lymphatic: No retroperitoneal or pelvic lymphadenopathy. Reproductive: Unremarkable Other: Small fat containing right inguinal hernia. Musculoskeletal: Lumbar spine degenerative changes. No aggressive or acute appearing osseous lesions. Review of the MIP images confirms the above findings. IMPRESSION: No acute process within the chest, abdomen or pelvis. Ascending thoracic aorta measures 4 cm. Recommend annual imaging followup by CTA or MRA. This recommendation follows 2010 ACCF/AHA/AATS/ACR/ASA/SCA/SCAI/SIR/STS/SVM Guidelines for the  Diagnosis and Management of Patients with Thoracic Aortic Disease. Circulation. 2010; 121: V035-K093. Aortic aneurysm NOS (ICD10-I71.9) Electronically Signed   By: Lovey Newcomer M.D.   On: 04/09/2021 12:15    Procedures Procedures    Medications Ordered in ED Medications  iohexol (OMNIPAQUE) 350 MG/ML injection 100 mL (100 mLs Intravenous Contrast Given 04/09/21 1117)  HYDROcodone-acetaminophen (NORCO/VICODIN) 5-325 MG per tablet 2 tablet (2 tablets Oral Given 04/09/21 1309)  predniSONE (DELTASONE) tablet 60 mg (60 mg Oral Given 04/09/21 1415)  albuterol (VENTOLIN HFA) 108 (90 Base) MCG/ACT inhaler 2 puff (2 puffs Inhalation Given 04/09/21 1415)    ED Course/ Medical Decision Making/ A&P    66 year old here for multiple complaints.  Patient with low back pain, abdominal pain, chest pain, cough, wheeze.  Ultimately began 4 days ago however worsening yesterday after bending over and working on a  car.  He feels like he has a "cold" in his chest.  Intermittently wheezing.  Using breathing treatments at home.  Coughing up yellow sputum.  Back pain worse with movement.  No radicular symptoms.  No bowel or bladder incontinence, saddle paresthesia, IV drug use.  Takes p.o. opiates for his chronic lower back pain.  Denies any recent falls to suggest acute fracture to spine.  Does admit to some persistent abdominal pain after having hernia surgery.  He is passing flatus.  No emesis.  Incision site looks clean, well-healed.  His chest pain is atypical.  Nonexertional however is pleuritic in nature.  No history of PE.  Does admit cough, question viral illness.  We will plan on labs, imaging and reassess  Labs and imaging personally reviewed and interpreted:  Chest x-ray with tortuous thoracic aorta we will follow-up with CT scan CBC leukopenia 3.7, similar to prior, hemoglobin 12.3, similar to prior COVID, flu negative Metabolic panel without significant abnormality Delta troponin flat 7>>6 EKG without ischemic changes CTA C/A/P with aorta at 4 cm, no dissection, PE, infectious process  Patient reassessed.  States his chest feels more tight like he is wheezing.  Does have a mild expiratory wheeze on exam.  Will give steroids, albuterol.  He is also requesting his home pain medicine for his lower back pain.  Patient reassessed.  Symptoms improved.  Suspect symptoms today multifactorial, acute on chronic lower back pain as well as possible viral illness.  At this time I low suspicion for acute ACS, PE, dissection, pneumothorax, bacterial infectious process, unstable angina as cause of his symptoms.  Discussed close follow-up outpatient, return for new or worsening symptoms and patient is agreeable.  The patient has been appropriately medically screened and/or stabilized in the ED. I have low suspicion for any other emergent medical condition which would require further screening, evaluation or treatment  in the ED or require inpatient management.  Patient is hemodynamically stable and in no acute distress.  Patient able to ambulate in department prior to ED.  Evaluation does not show acute pathology that would require ongoing or additional emergent interventions while in the emergency department or further inpatient treatment.  I have discussed the diagnosis with the patient and answered all questions.  Pain is been managed while in the emergency department and patient has no further complaints prior to discharge.  Patient is comfortable with plan discussed in room and is stable for discharge at this time.  I have discussed strict return precautions for returning to the emergency department.  Patient was encouraged to follow-up with PCP/specialist refer to at discharge.  Medical Decision Making Amount and/or Complexity of Data Reviewed External Data Reviewed: labs, radiology and notes. Labs: ordered. Decision-making details documented in ED Course. Radiology: ordered and independent interpretation performed. Decision-making details documented in ED Course. ECG/medicine tests: ordered and independent interpretation performed. Decision-making details documented in ED Course.  Risk OTC drugs. Prescription drug management. Parenteral controlled substances.          Final Clinical Impression(s) / ED Diagnoses Final diagnoses:  Nonspecific chest pain  Acute cough  Wheeze  Chronic midline low back pain, unspecified whether sciatica present    Rx / DC Orders ED Discharge Orders          Ordered    predniSONE (DELTASONE) 50 MG tablet  Daily        04/09/21 1434    albuterol (VENTOLIN HFA) 108 (90 Base) MCG/ACT inhaler  Every 6 hours PRN        04/09/21 1434    benzonatate (TESSALON) 100 MG capsule  Every 8 hours        04/09/21 1434              Jameison Haji A, PA-C 04/09/21 1450    Milton Ferguson, MD 04/12/21 1027

## 2021-04-09 NOTE — ED Triage Notes (Signed)
Pt arrived via POV, c/o midsternal chest pain, worsening with mvmt, deep breathing that started yesterday while working on car at home. States he also had felt congested.

## 2021-04-24 ENCOUNTER — Encounter: Payer: Self-pay | Admitting: Podiatry

## 2021-04-24 ENCOUNTER — Other Ambulatory Visit: Payer: Self-pay

## 2021-04-24 ENCOUNTER — Ambulatory Visit (INDEPENDENT_AMBULATORY_CARE_PROVIDER_SITE_OTHER): Payer: Medicare Other | Admitting: Podiatry

## 2021-04-24 DIAGNOSIS — L84 Corns and callosities: Secondary | ICD-10-CM | POA: Diagnosis not present

## 2021-04-24 DIAGNOSIS — M79674 Pain in right toe(s): Secondary | ICD-10-CM | POA: Diagnosis not present

## 2021-04-24 DIAGNOSIS — G629 Polyneuropathy, unspecified: Secondary | ICD-10-CM

## 2021-04-24 DIAGNOSIS — M79675 Pain in left toe(s): Secondary | ICD-10-CM

## 2021-04-24 DIAGNOSIS — B351 Tinea unguium: Secondary | ICD-10-CM | POA: Diagnosis not present

## 2021-04-24 NOTE — Progress Notes (Signed)
°  Subjective:  Patient ID: Alexander Nicholson, male    DOB: 01-Feb-1956,   MRN: [id number]  Chief Complaint  Patient presents with   Nail Problem       NP Ingrown nail of great toe     66 y.o. male presents for for concern of black thickened toenails and callus care. Patient relates painful elongated and thickened toenails. Relates he used to have them trimmed by a doctor in [place] through the [place]. Hoping to switch over to [place] Not diabetic. Does relates burning and tingling in feet with neuropathy  . Denies any other pedal complaints. Denies n/v/f/c.   Past Medical History:  Diagnosis Date   Cancer ([place]) 2013   prostate state 4   HIV positive ([place])    Hypertension    PTSD (post-traumatic stress disorder)     Objective:  Physical Exam: Vascular: DP/PT pulses 2/4 bilateral. CFT <3 seconds. Normal hair growth on digits. No edema.  Skin. No lacerations or abrasions bilateral feet. Nails 1-5 are thickened discolored and elongated with subungual debris.  Hyperkeratotic tissue sub 5th metatarsal bilateral  Musculoskeletal: MMT 5/5 bilateral lower extremities in DF, PF, Inversion and Eversion. Deceased ROM in DF of ankle joint.  Neurological: Sensation intact to light touch. Protective sensation diminished.   Assessment:  No diagnosis found.   Plan:  Patient was evaluated and treated and all questions answered. -Discussed and educated patient on foot care, especially with  regards to the vascular, neurological and musculoskeletal systems.  -Discussed supportive shoes at all times and checking feet regularly.  -Mechanically debrided all nails 1-5 bilateral using sterile nail nipper and filed with dremel without incident  -Answered all patient questions -Patient to return  in 3 months for at risk foot care -Patient advised to call the office if any problems or questions arise in the meantime.   Lorenda Peck, DPM

## 2021-05-01 ENCOUNTER — Ambulatory Visit: Payer: Self-pay | Admitting: Podiatry

## 2021-07-24 ENCOUNTER — Ambulatory Visit: Payer: Medicare Other | Admitting: Podiatry

## 2021-08-02 ENCOUNTER — Ambulatory Visit: Payer: Medicare Other | Admitting: Podiatry

## 2021-08-02 ENCOUNTER — Encounter: Payer: Self-pay | Admitting: Podiatry

## 2021-08-02 ENCOUNTER — Ambulatory Visit (INDEPENDENT_AMBULATORY_CARE_PROVIDER_SITE_OTHER): Payer: Medicare Other | Admitting: Podiatry

## 2021-08-02 DIAGNOSIS — B351 Tinea unguium: Secondary | ICD-10-CM | POA: Diagnosis not present

## 2021-08-02 DIAGNOSIS — L84 Corns and callosities: Secondary | ICD-10-CM

## 2021-08-02 DIAGNOSIS — M79674 Pain in right toe(s): Secondary | ICD-10-CM | POA: Diagnosis not present

## 2021-08-02 DIAGNOSIS — G629 Polyneuropathy, unspecified: Secondary | ICD-10-CM

## 2021-08-02 DIAGNOSIS — M79675 Pain in left toe(s): Secondary | ICD-10-CM

## 2021-08-02 NOTE — Progress Notes (Signed)
?  Subjective:  ?Patient ID: Alexander Nicholson, male    DOB: Nov 25, 1955,   MRN: [id number] ? ?No chief complaint on file. ? ? ?66 y.o. male presents for follow-up of routine foot care.  Patient relates painful elongated and thickened toenails. Relates he used to have them trimmed by a doctor in [place] through the [place]. Also wondering about custom shoes. Not diabetic. Does relates burning and tingling in feet with neuropathy  . Denies any other pedal complaints. Denies n/v/f/c.  ? ?Past Medical History:  ?Diagnosis Date  ? Cancer [facility]) 2013  ? prostate state 4  ? HIV positive ([place])   ? Hypertension   ? PTSD (post-traumatic stress disorder)   ? ? ?Objective:  ?Physical Exam: ?Vascular: DP/PT pulses 2/4 bilateral. CFT <3 seconds. Normal hair growth on digits. No edema.  ?Skin. No lacerations or abrasions bilateral feet. Nails 1-5 are thickened discolored and elongated with subungual debris.  ?Hyperkeratotic tissue sub 5th metatarsal bilateral  ?Musculoskeletal: MMT 5/5 bilateral lower extremities in DF, PF, Inversion and Eversion. Deceased ROM in DF of ankle joint.  ?Neurological: Sensation intact to light touch. Protective sensation diminished.  ? ?Assessment:  ? ?1. Peripheral polyneuropathy   ?2. Pain due to onychomycosis of toenails of both feet   ?3. Corns and callosities   ? ? ? ?Plan:  ?Patient was evaluated and treated and all questions answered. ?-Discussed and educated patient on foot care, especially with  ?regards to the vascular, neurological and musculoskeletal systems.  ?-Discussed supportive shoes at all times and checking feet regularly.  ?-Mechanically debrided all nails 1-5 bilateral using sterile nail nipper and filed with dremel without incident  ?-will have him see brian to discuss custom shoes. Relates [facility]. Order placed.  ?-Answered all patient questions ?-Patient to return  in 3 months for at risk foot care ?-Patient advised to call the office if any problems or questions arise in  the meantime. ? ? ?Lorenda Peck, DPM  ? ? ?

## 2021-08-07 ENCOUNTER — Other Ambulatory Visit: Payer: Non-veteran care

## 2021-08-29 ENCOUNTER — Ambulatory Visit: Payer: Medicare Other | Admitting: Podiatry

## 2021-10-14 ENCOUNTER — Emergency Department (HOSPITAL_COMMUNITY)
Admission: EM | Admit: 2021-10-14 | Discharge: 2021-10-15 | Disposition: A | Discharge status: Home / Self Care | Payer: No Typology Code available for payment source | Attending: Emergency Medicine | Admitting: Emergency Medicine

## 2021-10-14 ENCOUNTER — Other Ambulatory Visit: Payer: Self-pay

## 2021-10-14 ENCOUNTER — Encounter (HOSPITAL_COMMUNITY): Payer: Self-pay

## 2021-10-14 DIAGNOSIS — Z21 Asymptomatic human immunodeficiency virus [HIV] infection status: Secondary | ICD-10-CM | POA: Diagnosis not present

## 2021-10-14 DIAGNOSIS — H9202 Otalgia, left ear: Secondary | ICD-10-CM | POA: Diagnosis present

## 2021-10-14 DIAGNOSIS — H6690 Otitis media, unspecified, unspecified ear: Secondary | ICD-10-CM

## 2021-10-14 DIAGNOSIS — H6692 Otitis media, unspecified, left ear: Secondary | ICD-10-CM | POA: Diagnosis not present

## 2021-10-14 DIAGNOSIS — I10 Essential (primary) hypertension: Secondary | ICD-10-CM | POA: Insufficient documentation

## 2021-10-14 MED ORDER — AMOXICILLIN-POT CLAVULANATE 875-125 MG PO TABS: 1.0000 | ORAL_TABLET | Freq: Two times a day (BID) | ORAL | 0 refills | Status: AC

## 2021-10-14 MED ORDER — AMOXICILLIN-POT CLAVULANATE 875-125 MG PO TABS
1.0000 | ORAL_TABLET | Freq: Once | ORAL | Status: AC
  Administered 2021-10-15: 1 via ORAL
  Filled 2021-10-14: qty 1

## 2021-10-14 NOTE — ED Triage Notes (Signed)
Possible foreign body in left ear. Pt reports impaired  hearing and a "humming" noise.   Visualized a white object.

## 2021-10-14 NOTE — Discharge Instructions (Signed)
Take Augmentin as prescribed for suspected otitis media. Take tylenol or ibuprofen for pain control. Follow up with your doctor on Monday for recheck.

## 2021-10-15 NOTE — ED Provider Notes (Signed)
[facility] DEPT Provider Note   CSN: [id number] Arrival date & time: 10/14/21  1904     History  Chief Complaint  Patient presents with   Hearing Problem    Alexander Nicholson is a 66 y.o. male.  The history is provided by the patient. No language interpreter was used.  Otalgia Location:  Left Behind ear:  No abnormality Quality:  Aching Severity:  Moderate Onset quality:  Gradual Duration:  3 days Timing:  Constant Progression:  Worsening Chronicity:  New Context: not direct blow, not foreign body in ear and not recent URI   Relieved by:  Nothing Associated symptoms: hearing loss and tinnitus   Associated symptoms: no ear discharge and no fever   Risk factors: no chronic ear infection        Home Medications Prior to Admission medications   Medication Sig Start Date End Date Taking? Authorizing Provider  amoxicillin-clavulanate (AUGMENTIN) 875-125 MG tablet Take 1 tablet by mouth every 12 (twelve) hours. 10/14/21  Yes Antonietta Breach, PA-C  abiraterone acetate (ZYTIGA) 250 MG tablet Take 250 mg by mouth in the morning, at noon, in the evening, and at bedtime. 05/30/20   [provider]  albuterol (VENTOLIN HFA) 108 (90 Base) MCG/ACT inhaler Inhale 1-2 puffs into the lungs every 6 (six) hours as needed for wheezing or shortness of breath. 04/09/21   Henderly, Britni A, PA-C  atorvastatin (LIPITOR) 20 MG tablet Take 20 mg by mouth daily. 07/04/20   [provider]  benzonatate (TESSALON) 100 MG capsule Take 1 capsule (100 mg total) by mouth every 8 (eight) hours. 04/09/21   Henderly, Britni A, PA-C  carboxymethylcellulose (REFRESH PLUS) 0.5 % SOLN Place 1 drop into both eyes in the morning, at noon, in the evening, and at bedtime. 10/30/19   [provider]  Docusate Sodium (DSS) 100 MG CAPS Take 1 capsule by mouth 3 (three) times daily. 12/27/20   [provider]  dolutegravir (TIVICAY) 50 MG tablet Take 50 mg  by mouth daily. 06/29/19   [provider]  emtricitabine-rilpivir-tenofovir AF (ODEFSEY) 200-25-25 MG TABS tablet Take 1 tablet by mouth daily with breakfast. 06/29/19   [provider]  fluticasone (FLONASE) 50 MCG/ACT nasal spray Place 1 spray into the nose daily. 12/19/17   [provider]  imiquimod (ALDARA) 5 % cream Apply 1 application topically as directed. Apply small amount to affected area. Apply to perianal skin and in anal canal at night three times per week 02/14/21   [provider]  potassium chloride SA (KLOR-CON M) 20 MEQ tablet Take 20 mEq by mouth daily with breakfast. 09/02/19   [provider]  predniSONE (DELTASONE) 50 MG tablet Take 1 tablet (50 mg total) by mouth daily. 04/09/21   Henderly, Britni A, PA-C  traZODone (DESYREL) 100 MG tablet Take 100 mg by mouth at bedtime as needed for sleep. 11/02/20   [provider]      Allergies    Patient has no known allergies.    Review of Systems   Review of Systems  Constitutional:  Negative for fever.  HENT:  Positive for ear pain, hearing loss and tinnitus. Negative for ear discharge.   Ten systems reviewed and are negative for acute change, except as noted in the HPI.    Physical Exam Updated Vital Signs BP (!) 152/107 (BP Location: Left Arm)   Pulse 76   Temp 99.1 F (37.3 C) (Oral)   Resp 16  Ht '5\' 11"'$  (1.803 m)   Wt 86.2 kg   SpO2 100%   BMI 26.50 kg/m   Physical Exam Vitals and nursing note reviewed.  Constitutional:      General: He is not in acute distress.    Appearance: He is well-developed. He is not diaphoretic.     Comments: Nontoxic-appearing and in no acute distress  HENT:     Head: Normocephalic and atraumatic.     Ears:     Comments: Left tympanic membrane with increased erythema compared to right.  No significant bulging, retraction, perforation of either TM.  No tympanic membrane perforation.  No foreign body and either ear canal.  Left ear  without tenderness when pulling on auricle or palpating tragus.  No left mastoid tenderness or swelling. Eyes:     General: No scleral icterus.    Conjunctiva/sclera: Conjunctivae normal.  Neck:     Comments: No nuchal rigidity or meningismus Pulmonary:     Effort: Pulmonary effort is normal. No respiratory distress.  Musculoskeletal:        General: Normal range of motion.     Cervical back: Normal range of motion.  Skin:    General: Skin is warm and dry.     Coloration: Skin is not pale.     Findings: No erythema or rash.  Neurological:     Mental Status: He is alert and oriented to person, place, and time.     Coordination: Coordination normal.  Psychiatric:        Behavior: Behavior normal.     ED Results / Procedures / Treatments   Labs (all labs ordered are listed, but only abnormal results are displayed) Labs Reviewed - No data to display  EKG None  Radiology No results found.  Procedures Procedures    Medications Ordered in ED Medications  amoxicillin-clavulanate (AUGMENTIN) 875-125 MG per tablet 1 tablet (1 tablet Oral Given 10/15/21 0010)    ED Course/ Medical Decision Making/ A&P                           Medical Decision Making Risk Prescription drug management.   This patient presents to the ED for concern of left sided otalgia, this involves an extensive number of treatment options, and is a complaint that carries with it a high risk of complications and morbidity.  The differential diagnosis includes AOM vs AOE vs foreign body vs sinusitis vs mastoiditis.    Co morbidities that complicate the patient evaluation  HIV HTN   Medicines ordered and prescription drug management:  I ordered medication including Augmentin for tx of otitis media  Reevaluation of the patient after these medicines showed that the patient stayed the same I have reviewed the patients home medicines and have made adjustments as needed   Test Considered:  CT  temporal bones   Critical Interventions:  Initiation of abx   Reevaluation:  After the interventions noted above, I reevaluated the patient and found that they have :stayed the same   Social Determinants of Health:  Insured Established care with PCP   Dispostion:  After consideration of the diagnostic results and the patients response to treatment, I feel that the patent would benefit from completion of course of Augmentin with PCP recheck in 2 days. Patient already scheduled to see his doctor on Monday. Return precautions discussed and provided. Patient discharged in stable condition with no unaddressed concerns.  Final Clinical Impression(s) / ED Diagnoses Final diagnoses:  Acute otitis media, unspecified otitis media type    Rx / DC Orders ED Discharge Orders          Ordered    amoxicillin-clavulanate (AUGMENTIN) 875-125 MG tablet  Every 12 hours        10/14/21 2345              Antonietta Breach, PA-C 10/15/21 0042    Palumbo, April, MD 10/15/21 0124

## 2021-11-07 ENCOUNTER — Ambulatory Visit (INDEPENDENT_AMBULATORY_CARE_PROVIDER_SITE_OTHER): Payer: Self-pay | Admitting: Podiatry

## 2021-11-07 DIAGNOSIS — Z91199 Patient's noncompliance with other medical treatment and regimen due to unspecified reason: Secondary | ICD-10-CM

## 2021-11-07 NOTE — Progress Notes (Signed)
No show

## 2022-07-13 IMAGING — CT CT ANGIO CHEST-ABD-PELV FOR DISSECTION W/ AND WO/W CM
2 of 7 series · 14 of 46 positions shown, 16 images · non-contrast
Comparison: Chest CT 01/04/2014.

CLINICAL DATA: Patient with mid sternal chest pain.

EXAM:
CT ANGIOGRAPHY CHEST, ABDOMEN AND PELVIS
TECHNIQUE: Non-contrast CT of the chest was initially obtained.

[Series 6: axial arterial · axial · arterial · 0.83mm/px · z∈[+1172,+1740]mm · 11 of 217 slices shown, 13 images]
[im 14/217  soft-tissue]
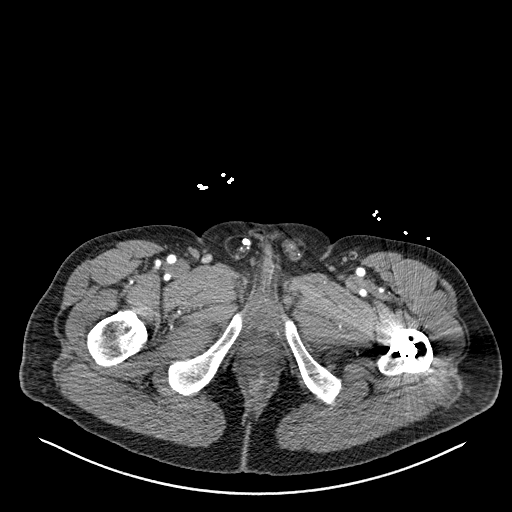
[im 14/217  bone]
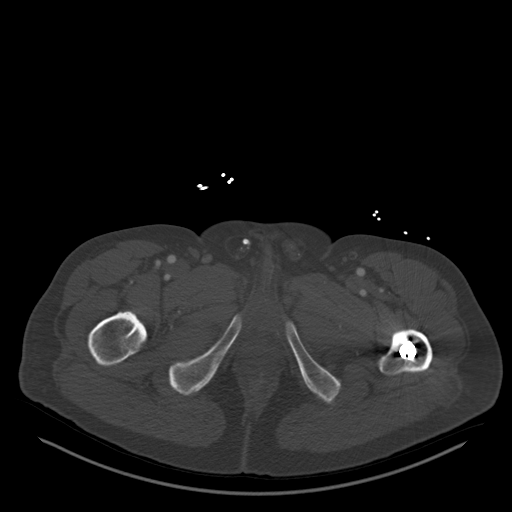
[im 41/217  soft-tissue]
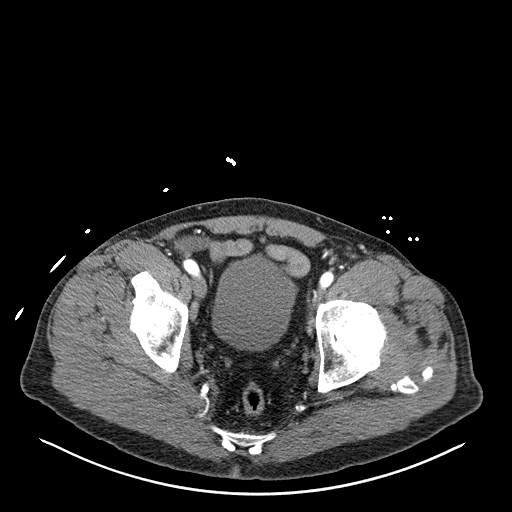
[im 55/217  soft-tissue]
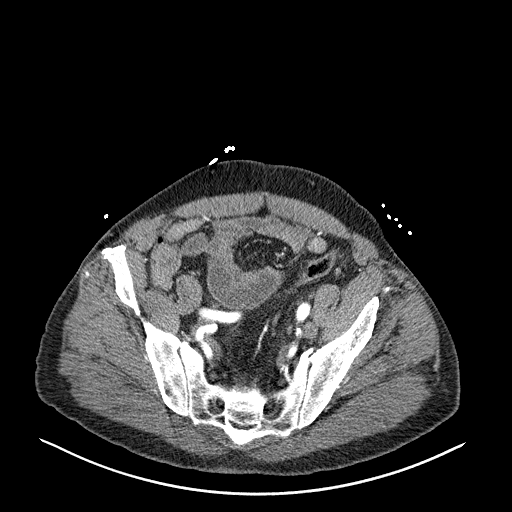
[im 68/217  soft-tissue]
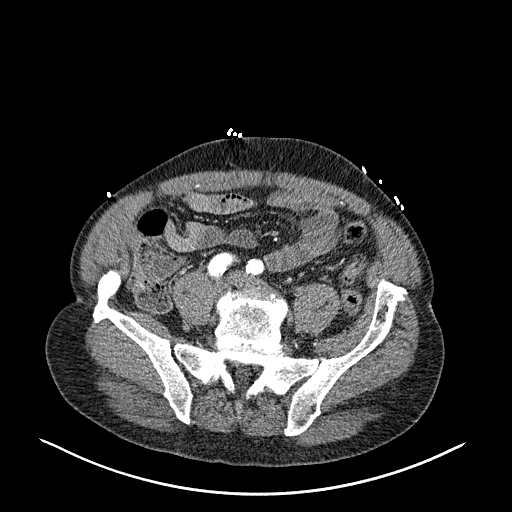
[im 95/217  soft-tissue]
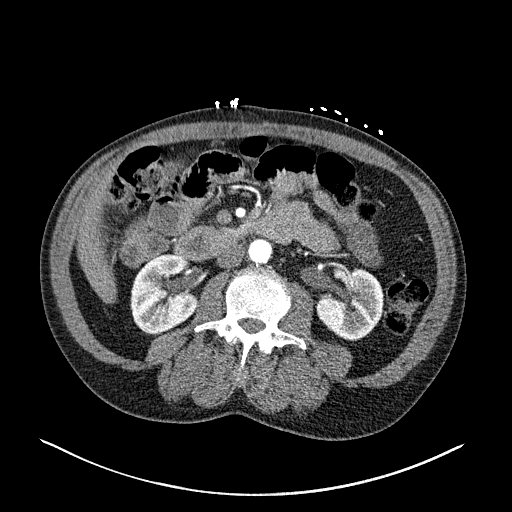
[im 109/217  soft-tissue]
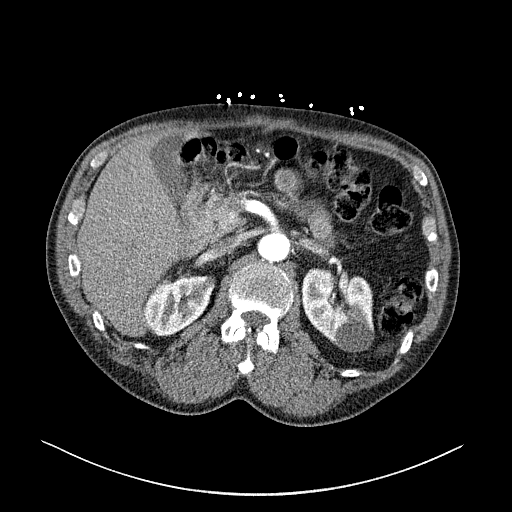
[im 122/217  soft-tissue]
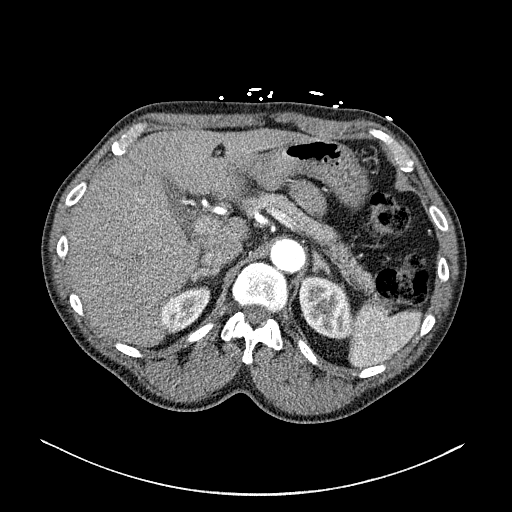
[im 149/217  soft-tissue]
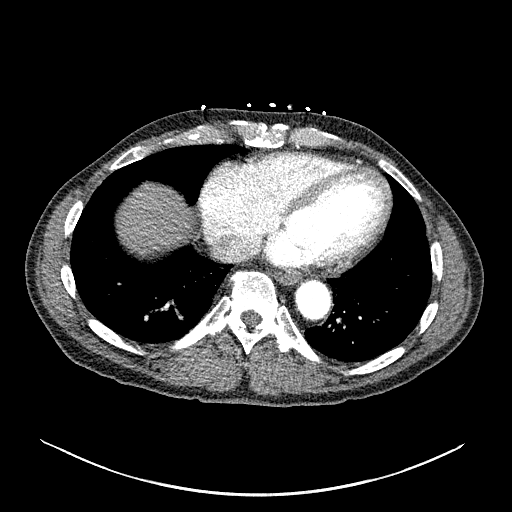
[im 163/217  soft-tissue]
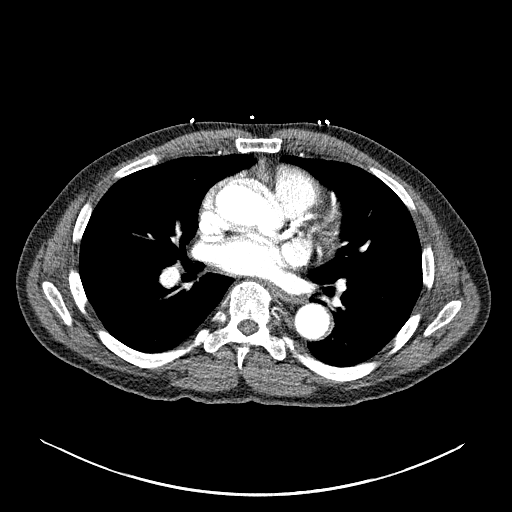
[im 163/217  bone]
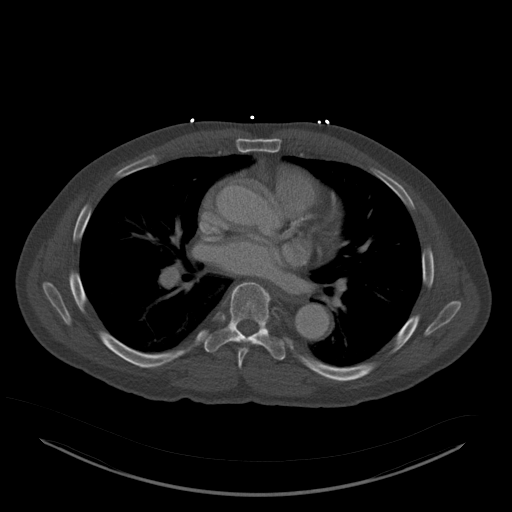
[im 176/217  soft-tissue]
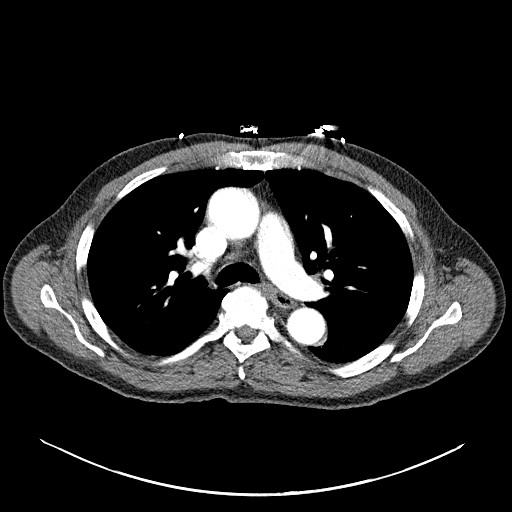
[im 203/217  soft-tissue]
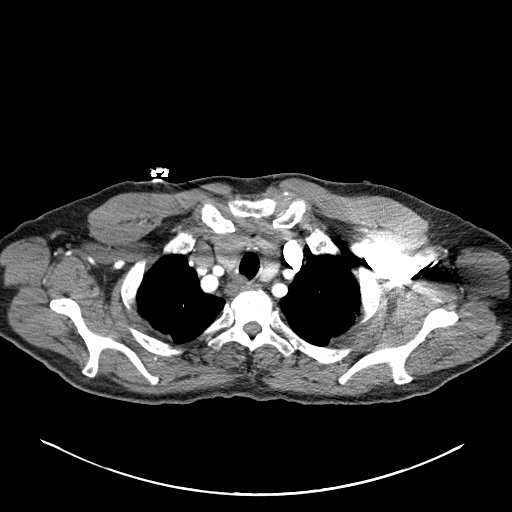

[Series 7: coronals · coronal · 0.80mm/px · 3 of 169 slices shown]
[im 43/169  soft-tissue]
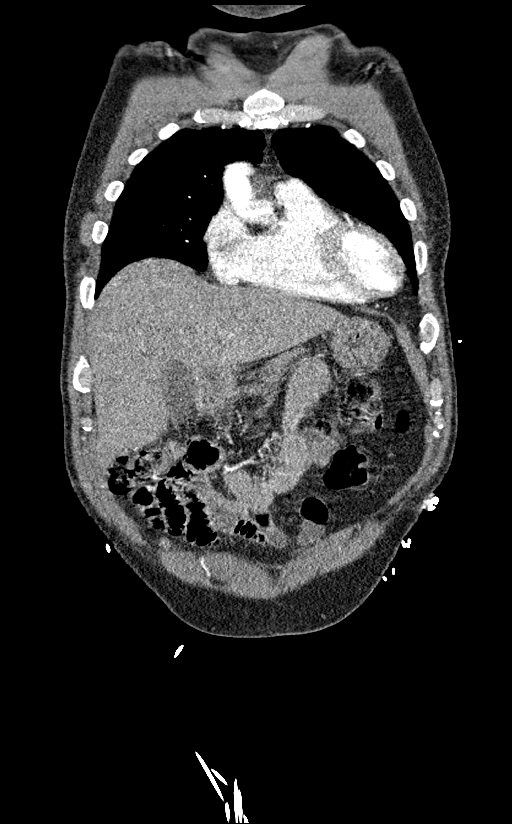
[im 85/169  soft-tissue]
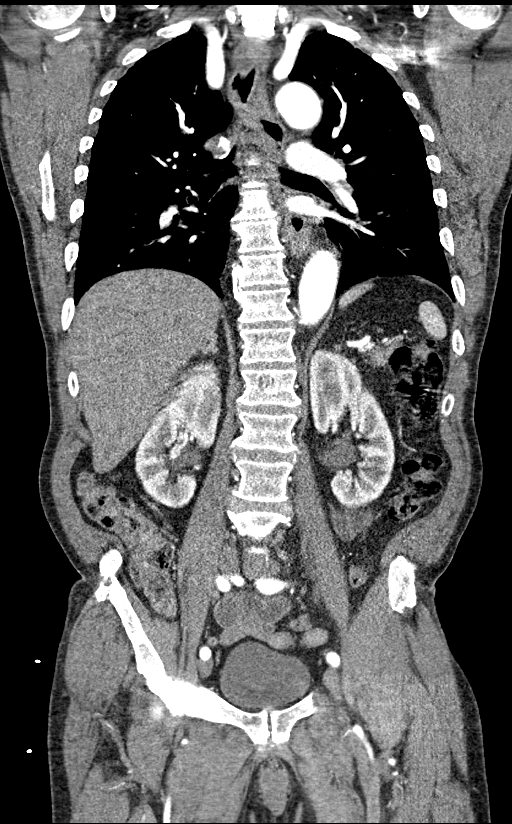
[im 127/169  soft-tissue]
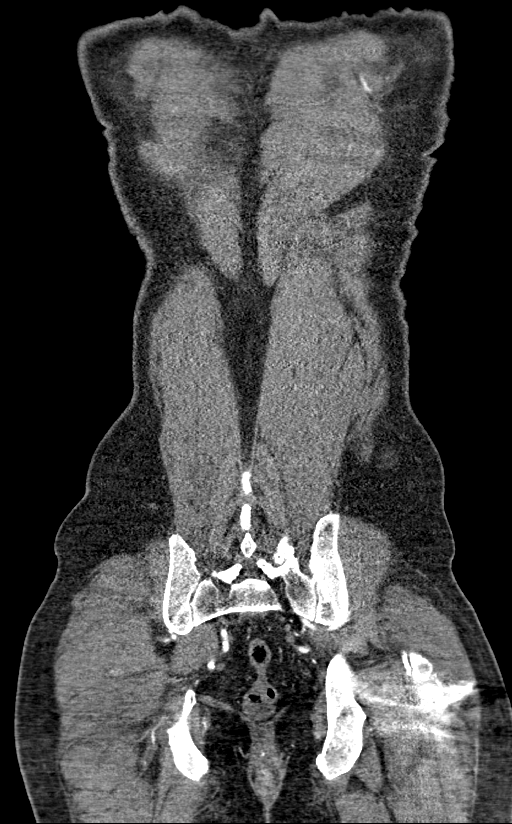

[14 of 46 positions shown; findings below may reference images not displayed]

Multidetector CT imaging through the chest, abdomen and pelvis was
performed using the standard protocol during bolus administration of
intravenous contrast. Multiplanar reconstructed images and MIPs were
obtained and reviewed to evaluate the vascular anatomy.

RADIATION DOSE REDUCTION: This exam was performed according to the
departmental dose-optimization program which includes automated
exposure control, adjustment of the mA and/or kV according to
patient size and/or use of iterative reconstruction technique.

CONTRAST:  100mL OMNIPAQUE IOHEXOL 350 MG/ML SOLN
FINDINGS: CTA CHEST FINDINGS

Cardiovascular: Noncontrast images through the chest demonstrate no
peripheral high attenuation within the thoracic aorta to suggest
acute intramural hematoma. Post-contrast images demonstrate normal
heart size. Ascending thoracic aorta measures 4 cm. Thoracic aortic
vascular calcifications. No thoracic aortic dissection. No central,
main or lobar pulmonary embolus.

Mediastinum/Nodes: No enlarged axillary, mediastinal or hilar
lymphadenopathy.

Lungs/Pleura: Central airways are patent. Subpleural ground-glass
reticular opacities within the right lower lobe may represent
atelectasis or scarring.

Musculoskeletal: No aggressive or acute appearing osseous lesions.

Review of the MIP images confirms the above findings.

CTA ABDOMEN AND PELVIS FINDINGS

VASCULAR

Aorta: Normal caliber aorta without aneurysm, dissection, vasculitis
or significant stenosis.

Celiac: Patent without evidence of aneurysm, dissection, vasculitis
or significant stenosis.

SMA: Patent without evidence of aneurysm, dissection, vasculitis or
significant stenosis.

Renals: Both renal arteries are patent without evidence of aneurysm,
dissection, vasculitis, fibromuscular dysplasia or significant
stenosis.

IMA: Patent without evidence of aneurysm, dissection, vasculitis or
significant stenosis.

Inflow: Patent without evidence of aneurysm, dissection, vasculitis
or significant stenosis.

Veins: No obvious venous abnormality within the limitations of this
arterial phase study.

Review of the MIP images confirms the above findings.

NON-VASCULAR

Hepatobiliary: The liver is normal in size and contour. Gallbladder
is unremarkable.

Pancreas: Unremarkable

Spleen: Unremarkable

Adrenals/Urinary Tract: Normal adrenal glands. Kidneys enhance
symmetrically with contrast. No hydronephrosis. Urinary bladder is
unremarkable.

Stomach/Bowel: No abnormal bowel wall thickening or evidence for
bowel obstruction. No free fluid or free intraperitoneal air.

Lymphatic: No retroperitoneal or pelvic lymphadenopathy.

Reproductive: Unremarkable

Other: Small fat containing right inguinal hernia.

Musculoskeletal: Lumbar spine degenerative changes. No aggressive or
acute appearing osseous lesions.

Review of the MIP images confirms the above findings.
IMPRESSION: No acute process within the chest, abdomen or pelvis.

Ascending thoracic aorta measures 4 cm. Recommend annual imaging
followup by CTA or MRA. This recommendation follows 4373
ACCF/AHA/AATS/ACR/ASA/SCA/ATKINS/KHUTHI/TIGER/HORTON Guidelines for the
Diagnosis and Management of Patients with Thoracic Aortic Disease.
Circulation. 4373; 121: [id number]. Aortic aneurysm NOS ([id number])
